# Patient Record
Sex: Male | Born: 1950
Health system: Southern US, Community
[De-identification: ages and names within clinical notes are randomized; demographics above are authoritative.]

## PROBLEM LIST (undated history)

## (undated) DIAGNOSIS — E65 Localized adiposity: Secondary | ICD-10-CM

## (undated) DIAGNOSIS — N529 Male erectile dysfunction, unspecified: Secondary | ICD-10-CM

## (undated) DIAGNOSIS — I495 Sick sinus syndrome: Secondary | ICD-10-CM

## (undated) DIAGNOSIS — I10 Essential (primary) hypertension: Secondary | ICD-10-CM

## (undated) DIAGNOSIS — T8859XA Other complications of anesthesia, initial encounter: Secondary | ICD-10-CM

## (undated) DIAGNOSIS — M25562 Pain in left knee: Secondary | ICD-10-CM

## (undated) DIAGNOSIS — G4733 Obstructive sleep apnea (adult) (pediatric): Secondary | ICD-10-CM

## (undated) DIAGNOSIS — R42 Dizziness and giddiness: Secondary | ICD-10-CM

## (undated) DIAGNOSIS — E6609 Other obesity due to excess calories: Secondary | ICD-10-CM

## (undated) DIAGNOSIS — Z9989 Dependence on other enabling machines and devices: Secondary | ICD-10-CM

## (undated) DIAGNOSIS — M7580 Other shoulder lesions, unspecified shoulder: Secondary | ICD-10-CM

## (undated) DIAGNOSIS — Z950 Presence of cardiac pacemaker: Secondary | ICD-10-CM

## (undated) DIAGNOSIS — M722 Plantar fascial fibromatosis: Secondary | ICD-10-CM

## (undated) DIAGNOSIS — R55 Syncope and collapse: Secondary | ICD-10-CM

## (undated) DIAGNOSIS — T4145XA Adverse effect of unspecified anesthetic, initial encounter: Secondary | ICD-10-CM

## (undated) DIAGNOSIS — I4510 Unspecified right bundle-branch block: Secondary | ICD-10-CM

## (undated) DIAGNOSIS — F419 Anxiety disorder, unspecified: Secondary | ICD-10-CM

## (undated) DIAGNOSIS — K219 Gastro-esophageal reflux disease without esophagitis: Secondary | ICD-10-CM

## (undated) HISTORY — DX: Other shoulder lesions, unspecified shoulder: M75.80

## (undated) HISTORY — DX: Other obesity due to excess calories: E66.09

## (undated) HISTORY — DX: Plantar fascial fibromatosis: M72.2

## (undated) HISTORY — DX: Pain in left knee: M25.562

## (undated) HISTORY — PX: DECOMPRESSIVE LUMBAR LAMINECTOMY LEVEL 4: SHX5794

## (undated) HISTORY — DX: Unspecified right bundle-branch block: I45.10

## (undated) HISTORY — PX: MICRODISCECTOMY LUMBAR: SUR864

## (undated) HISTORY — DX: Syncope and collapse: R55

## (undated) HISTORY — DX: Obstructive sleep apnea (adult) (pediatric): G47.33

## (undated) HISTORY — PX: DECOMPRESSIVE LUMBAR LAMINECTOMY LEVEL 3: SHX5793

## (undated) HISTORY — DX: Sick sinus syndrome: I49.5

## (undated) HISTORY — PX: SEPTOPLASTY: SUR1290

## (undated) HISTORY — DX: Dizziness and giddiness: R42

## (undated) HISTORY — DX: Localized adiposity: E65

## (undated) HISTORY — DX: Obstructive sleep apnea (adult) (pediatric): Z99.89

## (undated) HISTORY — DX: Male erectile dysfunction, unspecified: N52.9

## (undated) HISTORY — PX: BUNIONECTOMY: SHX129

## (undated) HISTORY — DX: Essential (primary) hypertension: I10

---

## 1992-03-17 HISTORY — PX: COLON SURGERY: SHX602

## 1992-03-17 HISTORY — PX: APPENDECTOMY: SHX54

## 1999-01-06 ENCOUNTER — Ambulatory Visit (HOSPITAL_COMMUNITY): Admission: RE | Admit: 1999-01-06 | Discharge: 1999-01-06 | Payer: Self-pay

## 1999-01-06 ENCOUNTER — Encounter: Payer: Self-pay | Admitting: Neurological Surgery

## 2000-08-31 ENCOUNTER — Emergency Department (HOSPITAL_COMMUNITY): Admission: EM | Admit: 2000-08-31 | Discharge: 2000-08-31 | Payer: Self-pay | Admitting: Emergency Medicine

## 2000-09-09 ENCOUNTER — Emergency Department (HOSPITAL_COMMUNITY): Admission: EM | Admit: 2000-09-09 | Discharge: 2000-09-09 | Payer: Self-pay | Admitting: Emergency Medicine

## 2003-04-06 ENCOUNTER — Ambulatory Visit (HOSPITAL_COMMUNITY): Admission: RE | Admit: 2003-04-06 | Discharge: 2003-04-06 | Payer: Self-pay | Admitting: *Deleted

## 2003-04-15 ENCOUNTER — Emergency Department (HOSPITAL_COMMUNITY): Admission: EM | Admit: 2003-04-15 | Discharge: 2003-04-15 | Payer: Self-pay | Admitting: Emergency Medicine

## 2003-04-20 ENCOUNTER — Ambulatory Visit (HOSPITAL_COMMUNITY): Admission: RE | Admit: 2003-04-20 | Discharge: 2003-04-20 | Payer: Self-pay | Admitting: *Deleted

## 2003-07-17 ENCOUNTER — Inpatient Hospital Stay (HOSPITAL_COMMUNITY): Admission: RE | Admit: 2003-07-17 | Discharge: 2003-07-19 | Payer: Self-pay | Admitting: Cardiovascular Disease

## 2003-07-18 HISTORY — PX: PACEMAKER INSERTION: SHX728

## 2006-01-15 ENCOUNTER — Ambulatory Visit (HOSPITAL_COMMUNITY): Admission: RE | Admit: 2006-01-15 | Discharge: 2006-01-15 | Payer: Self-pay | Admitting: Cardiovascular Disease

## 2006-10-07 ENCOUNTER — Encounter: Admission: RE | Admit: 2006-10-07 | Discharge: 2006-10-07 | Payer: Self-pay | Admitting: Orthopedic Surgery

## 2006-10-21 ENCOUNTER — Ambulatory Visit (HOSPITAL_COMMUNITY): Admission: RE | Admit: 2006-10-21 | Discharge: 2006-10-22 | Payer: Self-pay | Admitting: Orthopedic Surgery

## 2006-10-21 HISTORY — PX: BACK SURGERY: SHX140

## 2007-02-19 ENCOUNTER — Encounter: Admission: RE | Admit: 2007-02-19 | Discharge: 2007-02-19 | Payer: Self-pay | Admitting: Internal Medicine

## 2007-02-26 ENCOUNTER — Encounter: Admission: RE | Admit: 2007-02-26 | Discharge: 2007-02-26 | Payer: Self-pay | Admitting: Gastroenterology

## 2010-07-30 NOTE — Op Note (Signed)
NAMESAMARTH, OGLE NO.:  [id number]   MEDICAL RECORD NO.:  [id number]          PATIENT TYPE:  AMB   LOCATION:  DAY                          FACILITY:  [facility]   PHYSICIAN:  Georges Lynch. Gioffre, M.D.DATE OF BIRTH:  1951-03-13   DATE OF PROCEDURE:  10/21/2006  DATE OF DISCHARGE:                               OPERATIVE REPORT   SURGEON:  Georges Lynch. Darrelyn Hillock, M.D.   ASSISTANT:  Marlowe Kays M.D.   PREOPERATIVE DIAGNOSES:  1. Spinal stenosis at L3-4.  2. Herniated lumbar disc at L3-4, with migration of the disc fragments      cephalad.  He had right leg pain only preoperatively, and the pain      went down into his right anterior thigh, down to the right knee and      slightly distal.  3. He had a previous lumbar laminectomy in 1985 at L5-S1 on the right.   POSTOPERATIVE DIAGNOSES:  1. Spinal stenosis at L3-4.  2. Herniated lumbar disc at L3-4, with migration of the disc fragments      cephalad.  He had right leg pain only preoperatively, and the pain      went down into his right anterior thigh, down to the right knee and      slightly distal.  3. He had a previous lumbar laminectomy in 1985 at L5-S1 on the right.   OPERATIONS:  1. Decompressive lumbar laminectomy for spinal stenosis at L3-4.  2. Microdiskectomy at L3-4 on the right.  3. Foraminotomy on the right at L3-4.   PROCEDURE:  The patient first had 2 g of IV Ancef preop.  At this time,  a sterile prep and draping were carried out with the patient on the  spinal frame.  Two needles were placed in the back for localization  purposes, and an x-ray was taken.  Once we localized the L3-4  interspace, an incision was made.  Bleeders were identified and  cauterized.  The muscle then was stripped from the lamina and spinous  processes in the usual fashion.  I then took another x-ray to verify our  position, and finally a third x-ray was taken as well.  We then carried  out the central decompressive lumbar  laminectomy in the usual fashion.  We removed a markedly thickened ligamentum flavum.  We exposed the dura,  brought the microscope in, and went out far laterally in the recess on  the right at L3-4 because of the nature of the disc rupture.  The disc  rupture migrated cephalad and also out into the foramen.  So, once we  cleared the lateral recess, we cauterized the lateral recess veins with  the bipolar.  I then made a cruciate incision into the disc space and a  large amount of disc under pressure came out.  Following that, I went up  with a nerve hook up cephalad over the body of L3 and removed several  other pieces of disc.  Following that, we went down into the disc space  again to complete the diskectomy.  We went across the midline,  subligamentous  as well, and out into the foramen as well.  Once we had  the procedure completed with a diskectomy, the dura now was rather  freely.  The nerve root was very easily mobile. We were able to easily  displace it.  We examined the foramen with a hockey-stick.  It was nice  and free.  We thoroughly irrigated out the area and removed the fluid,  and loosely applied some thrombin-soaked Gelfoam.  The wound was closed  in layers in the usual fashion.  I did leave a small portion of the  proximal and  distal deep portions of the wound open for drainage purposes to avoid  any compression on the sac.  Sterile staples were placed into the skin.  Sterile Neosporin dressing was applied, and the patient left the  operative room in satisfactory condition.  Prior to leaving surgery, he  had Toradol 30 mg IV.           ______________________________  Georges Lynch. Darrelyn Hillock, M.D.     RAG/MEDQ  D:  10/21/2006  T:  10/22/2006  Job:  [id number]

## 2010-08-02 NOTE — Cardiovascular Report (Signed)
NAME:  Brent Stuart, Brent Stuart                       ACCOUNT NO.:  [id number]   MEDICAL RECORD NO.:  [id number]                   PATIENT TYPE:  OIB   LOCATION:  2854                                 FACILITY:  [facility]   PHYSICIAN:  Darlin Priestly, M.D.             DATE OF BIRTH:  03/24/1950   DATE OF PROCEDURE:  04/06/2003  DATE OF DISCHARGE:                              CARDIAC CATHETERIZATION   PROCEDURE:  Heads-up tilt-table testing with Isuprel infusion.   COMPLICATIONS:  None.   INDICATIONS:  [name] is a 60 year old male, patient of Dr. Caprice Kluver,  with a history of recurrent syncope with negative neurologic workup.  He has  had a positive tilt-table test according to his notes.  However, the patient  denies that he did have true loss of consciousness during the test.  He has  had a subsequent tilt-table test at [facility] which he believes was  negative.  He is now referred for repeat tilt-table testing.  His last  episode of syncope was in 2002.   DESCRIPTION OF OPERATION:  After giving informed consent the patient was  brought to the cardiac catheterization lab where his heart rate and blood  pressure were monitored.  He was placed in the supine position with resting  blood pressure of 137/92, resting heart rate of 96.  He was monitored for  approximately 10 minutes and then tilted to a 7-degree heads-up position.  This was maintained for approximately 30 minutes through which he remained  hemodynamically stable and asymptomatic.  He was again returned to a supine  position, and Isuprel infusion was begun.  This was maintained for  approximately 5-10 minutes, then he was again tilted a 7-degree heads-up  position where he was monitored for approximately 10 minutes.  He had no  drop in blood pressure or heart rate and remained asymptomatic.  The patient  was then again returned to a supine position.   CONCLUSION:  Negative heads-up tilt-table testing with Isuprel  infusion.                                               Darlin Priestly, M.D.    RHM/MEDQ  D:  04/06/2003  T:  04/07/2003  Job:  [id number]   cc:   Thereasa Solo. Little, M.D.  [address]  [address]  [place]  [place] [address]  Fax: [phone]

## 2010-08-02 NOTE — H&P (Signed)
NAME:  ISLAM, EICHINGER                       ACCOUNT NO.:  [id number]   MEDICAL RECORD NO.:  [id number]                   PATIENT TYPE:  INP   LOCATION:  2007                                 FACILITY:  [facility]   PHYSICIAN:  Richard A. Alanda Amass, M.D.          DATE OF BIRTH:  [birth year]   DATE OF ADMISSION:  07/17/2003  DATE OF DISCHARGE:                                HISTORY & PHYSICAL   Philemon Riedesel is a 60 year old white grandfather of two.  He is a smoker.  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  The patient was  admitted now for pacemaker implantation  __________  ventricular asystole of  13 seconds on July 15, 2003.   Mr. Desai is a patient of Dr. Mindi Junker Little's.  He had abdominal surgery done  in 1995 which was appendicitis complicated by colon involvement and had  partial colon resection.  Shortly after that, he had his first frank  syncopal episode.  Between 1995 and 2000, he had approximately 8-9 episodes  of frank syncope.  He had a negative tilt table test and during that period  of time, Cardiolite and echocardiogram.  The etiology of his syncope was  unclear, but he was disabled because of this, and he has not driven since  1997.   He also has a remote diagnosis of obstructive sleep apnea and is on CPAP.  He has been followed closely by Dr. Clarene Duke.   He was reevaluated March 22, 2003 by Dr. Clarene Duke and patient had not driven  in 5 years, and there was some question about allowing him to drive again.  For this reason, he underwent repeat tilt table testing which was negative.  He had seen Dr. Graciela Husbands in the past and a loop recorder was not advised. Dr.  Clarene Duke recommended a loop recorder if he had a negative tilt table or  recurrent symptoms.   Several days after seeing Dr. Clarene Duke, he had a recurrent syncopal episode  January 2005, was seen in the emergency room, no significant abnormalities  were noted. He subsequently underwent a loop recorder by  Dr. Jenne Campus which  was a Reveal Plus,  Model [id number], Serial [id number] implanted on April 20, 2003 in the left  parasternal region. This had been evaluated by Dr. Jenne Campus on 2 occasions,  February 11 and May 19, 2003 and there were no arrhythmias noted.  The  pervious episode prompting loop recorder was April 11, 2003 at a bar.   The patient walks about 2 miles three to four times a week, is a nonsmoker,  has exogenous obesity.   On Saturday, July 15, 2003, he had a syncopal episode while sitting on the  couch at home at approximately 11:52 a.m.  The patient had a faint aura of  presyncope but no palpitations or chest pain, had a frank syncopal episode  prior to his frank syncope.  He called for his daughter because  he thought  he might pass out. Over the last five years, he has had episodic presyncope  but these have been fairly transient up until this episode.  His eyes rolled  to his side but there was no seizure activity, incontinence of bowel or  urine.   He comes in today for clinical evaluation and interrogation of his Reveal.   On exam, his Reveal shows sinus arrest with episodes of six and three  seconds asystole, and then a long episode of 14 seconds of ventricular  asystole corresponding to his syncopal episode.  He has had no other  arrhythmias.   PHYSICAL EXAMINATION:  VITAL SIGNS:  He is 239, 6 feet 2, appears healthy.  HEENT:  No xanthelasmas, PERRLA, EOM intact.  NECK:  No carotid bruits.  There is no JVD, or HJR.  Thyroid not enlarged.  LUNGS:  Clear to P&A with no wheezes, rubs or rhonchi.  CARDIOVASCULAR:  Regular rate and rhythm.  He has a localized LV heave in  the left 6th ICS and the MCL. There is an S4, no S3.  No significant murmur,  no rub.  ABDOMEN:  Liver, spleen and kidneys not palpable.  Not palpable.  EXTREMITIES:  Femorals intact, DP and PT intact. No pathologic reflexes.   LABORATORY DATA:  A 12-lead electrocardiogram shows sinus rhythm with  minor  nonspecific ST-T changes, QRS duration of 0.86, unchanged from prior EKG's,  resting rate of 97.  No orthostatic blood pressure drop.   Cardiolite July 2003 showed an EF of 65% with no ischemia.   On April 27, 2003, laboratories showed normal CBC and differential,  BUN/creatinine 14/1.2.  H&H normal.  Chest x-ray shows loop recorder in the  left parasternal area, small cardiac silhouette, no infiltrates or effusions  and no masses.   FAMILY HISTORY:  Not significant for this problem, but his father is living  and had bypass surgery, is now age 62. He had bypass surgery at age 4.  Mother is living at 75.  He has 2 brothers, 12 and 27, alive and well, a  sister 59 alive and well and his 2 children are 44 and 61.   MEDICATIONS:  1. Toprol  XL 50.  2. Flexeril 10.  3. One baby aspirin a day.   ADMISSION DIAGNOSES:  1. Recurrent syncope with documented sinus arrest and ventricular asystole,     up to 13 seconds, corresponding to episode.  2. Recurrent syncope since 1995.  3. Exogenous obesity.  4. History of sleep apnea on CPAP.  5. Multiple prior syncopal episodes since 1995 with negative tilt table     test, negative Cardiolite for ischemia July 2003.  6. Remote abdominal surgery with partial colon resection and appendectomy     1995.   The procedure and risks of Reveal ex-plant and dual-chamber permanent  pacemaker implantation fully explained to the patient. He is agreeable to  proceed with this, and we plan to admit him to the hospital for this. He is  quite anxious about his recent episode, especially when the findings on loop  recorder were revealed to him. He prefers to go ahead with implantation as  soon as possible.                                                Richard A. Alanda Amass, M.D.  RAW/MEDQ  D:  07/17/2003  T:  07/17/2003  Job:  [id number]   cc:   Thora Lance, M.D.  [address]  [place] [address]  Fax: [phone]   Thereasa Solo. Little, M.D.  [address]  [address]  [place]  [place] [address]  Fax: [phone]

## 2010-08-02 NOTE — Discharge Summary (Signed)
NAME:  Brent Stuart, Brent Stuart                       ACCOUNT NO.:  [id number]   MEDICAL RECORD NO.:  [id number]                   PATIENT TYPE:  INP   LOCATION:  2007                                 FACILITY:  [facility]   PHYSICIAN:  Richard A. Alanda Amass, M.D.          DATE OF BIRTH:  03-03-1951   DATE OF ADMISSION:  07/17/2003  DATE OF DISCHARGE:  07/19/2003                                 DISCHARGE SUMMARY   DISCHARGE DIAGNOSIS:  Syncope with sinus pause noted on Loop recorder,  status post Medtronic pacemaker implantation this admission.   HOSPITAL COURSE:  Brent Stuart is a 60 year old male followed by Dr. Clarene Duke.  He had abdominal surgery in 1995 for appendicitis.  Shortly after that, he  had a syncopal spell.  Between 1995 and 2000 he had eight to nine episodes  of syncope.  During that time he has had a negative cardiac evaluation  including a Cardiolite study, echocardiogram and tilt table.  He has been  diagnosed with sleep apnea and is on CPAP.  In January 2005, he had a  recurrent episode of syncope and a Loop recorder was implanted by  Dr. Jenne Campus on February 3.  The patient has done well.  He walks two miles  three to four times a week.  Saturday, April 30, he had a syncopal spell  while watching TV at 11 a.m.  Loop recorder showed sinus arrest with one  episode of 14 seconds of ventricular asystole.  He was seen in the office by  Dr. Alanda Amass on Jul 17, 2003 and set up for pacemaker implantation which was  done Jul 18, 2003.  He had a Medtronic dual chamber pacemaker implanted  without complications.  We feel he can be discharged Jul 19, 2003.  Chest x-  ray prior to discharge showed no pneumothorax and pacer wires in good  position.   DISCHARGE MEDICATIONS:  None.   LABORATORIES:  White count 6.5, hemoglobin 15.4, hematocrit 43.1, platelets  225, INR 0.9.  Sodium 137, potassium 3.5, BUN 21, creatinine 1.2.  LFTs are  normal.   DISPOSITION:  The patient is discharged in stable  condition after pacemaker  implantation for syncope.  He will follow up with Corine Shelter PA-C on May 11  at 10:30 A.M. for a wound check and then follow up with  Dr. Clarene Duke and Dr. Alanda Amass.      Abelino Derrick, P.A.                      Richard A. Alanda Amass, M.D.    Lenard Lance  D:  07/19/2003  T:  07/20/2003  Job:  [id number]   cc:   Thereasa Solo. Little, M.D.  [address]  [address]  [place]  [place] [address]  Fax: [phone]

## 2010-08-02 NOTE — Cardiovascular Report (Signed)
NAME:  ADARIUS, TIGGES                       ACCOUNT NO.:  [id number]   MEDICAL RECORD NO.:  [id number]                   PATIENT TYPE:  OIB   LOCATION:  2853                                 FACILITY:  [facility]   PHYSICIAN:  Darlin Priestly, M.D.             DATE OF BIRTH:  10-Oct-1950   DATE OF PROCEDURE:  04/20/2003  DATE OF DISCHARGE:  04/20/2003                              CARDIAC CATHETERIZATION   PROCEDURE:  Loop recorder implant.   CARDIOLOGIST:  Darlin Priestly, M.D.   COMPLICATIONS:  None.   INDICATIONS:  Mr. Chain is a 60 year old male patient of Dr. Caprice Kluver  and Dr. Kirby Funk with a history of hypertension, dyslipidemia,  obstructive sleep apnea and recurrent syncope dating back approximately 5  years.  The patient has had a negative workup including ultrasounds,  Cardiolites and tilt table testing.  He is now referred for a loop recorder  implant to rule out significant arrhythmias and possible etiology.   DESCRIPTION OF PROCEDURE:  After informed consent the patient was brought to  the cardiac catheterization lab in a fasting state.  The left chest wall was  shaved, prepped and draped in a sterile fashion.  Anesthesia monitoring was  established.  Lidocaine 1% was then used to anesthetize approximately 2 cm  beneath the left clavicle and 2 cm left of the sternum.  Approximately 1.5-  to-2-cm, horizontal incision was then performed and blunt dissection was  used to carry this down to the left pectoral fascia.  Hemostasis was  obtained with electrocautery.   Next, an approximately 1 x 4 cm pocket was then created over the left  pectoral fascia and hemostasis was obtained.  Two silk sutures were then  placed at the apex of the pocket and the pocket was then copiously irrigated  with 1% Garamycin solution.   Next a Reveal Plus Model [id number], serial [id number] was then inserted into  the pocket and the 2 silk sutures then anchored to the loop header.  The  pocket subcutaneous layer was then closed using a running 2-0 Dexon with  closure of the skin layer using running 5-0 Dexon.  Steri-Strips were  applied.  The patient was then transferred to the recovery room in stable  condition.   CONCLUSION:  Successful implant of a Reveal Plus Model [id number], serial  [id number].                                               Darlin Priestly, M.D.    RHM/MEDQ  D:  04/20/2003  T:  04/21/2003  Job:  [id number]   cc:   Thereasa Solo. Little, M.D.  [address]  [address]  [place]  [place] [address]  Fax: [phone]   Thora Lance, M.D.  [address] [address]  [address]  [place]  [place] [address]  Fax: [phone]

## 2010-08-02 NOTE — Cardiovascular Report (Signed)
NAME:  Brent Stuart, Brent Stuart                       ACCOUNT NO.:  [id number]   MEDICAL RECORD NO.:  [id number]                   PATIENT TYPE:  INP   LOCATION:  2007                                 FACILITY:  [facility]   PHYSICIAN:  Richard A. Alanda Amass, M.D.          DATE OF BIRTH:  [birth year]   DATE OF PROCEDURE:  07/18/2003  DATE OF DISCHARGE:                              CARDIAC CATHETERIZATION   PROCEDURE:  Explantation of Reveal loop recorder-Medtronic Reveal Plus  #9526, SN:  [id number] (DOI April 20, 2003), implantation of new Medtronic  DDDR Enpulse model number [id number]; SN:  [id number] H pulse generator with  bipolar silicone steroid-eluting passive fixation tined Medtronic 53CM  atrial and 58CM ventricular electrodes #5594 and #5092 respectively.   IMPLANTING PHYSICIAN:  Richard A. Alanda Amass, M.D.   COMPLICATIONS:  None.   ESTIMATED BLOOD LOSS:  Approximately 60 mL.   ANESTHESIA:  5 mg of Valium p.o. premedication, 1 g of Ancef IV antibiotic  prophylaxis, intermittent Nubain 4 mg IV, intermittent Versed 6 mg IV during  procedure for sedation.   PREOPERATIVE DIAGNOSES:  1. Recurrent syncope since 1995.  2. Syncope with documented sinus arrest and ventricular asystole 13 seconds     on Reveal loop recorder Jul 17, 2003.  3. Prior negative tilt table test.  4. Abdominal surgery with appendectomy and partial colon removed in 1995.  5. Obstructive sleep apnea on CPAP.  6. Exogenous obesity.   POSTOPERATIVE DIAGNOSES:  1. Recurrent syncope since 1995.  2. Syncope with documented sinus arrest and ventricular asystole 13 seconds     on Reveal loop recorder Jul 17, 2003.  3. Prior negative tilt table test.  4. Abdominal surgery with appendectomy and partial colon removed in 1995.  5. Obstructive sleep apnea on CPAP.  6. Exogenous obesity.   Please refer to H&P for details.   ATRIAL ELECTRODE:  Medtronic [id number], SN:  [id number].   VENTRICULAR ELECTRODE:  Medtronic [id number], SN:   [id number].   There was no retrograde V-A conduction at a ventricular pacing rate of 120  with atrial recording through the implanted electrodes.   Antegrade Wenckebach  AV node was present at an atrial pacing rate of 170  per minute in the temporary AAI mode through the PSA.   Magnet rate at BOL equals 85, at [facility] magnet rate drops to 65 per minute.   THRESHOLDS:  Bipolar atrial:  Capture threshold 0.6 V at 0.5 msec; impedance  573 ohms; P equal 8.0 mV.   Bipolar ventricular:  Capture 0.3 V at 0.5 msec, resistance 733 ohms, R  waves 12.8 mV.   Unipolar atrial:  Capture threshold 0.3 V at 0.5 msec, impedance 438 ohms, P  wave 6.3 mV.   Unipolar ventricular:  Capture 0.2 V at 0.5 msec, resistance 629 ohms, R  equal 10.0 mV.   PROCEDURE:  The patient was brought from the telemetry unit to the second CP  lab in a postabsorptive state  after 5 mg of Valium p.o.  He was given  intermittent Nubain and Versed for sedation in the laboratory.  1% Xylocaine  was used for local anesthesia.  He was given 1 g of Ancef for IV prophylaxis  (remote history of penicillin allergy).   The left anterior chest was prepped, draped in the usual manner as well as  the implant left chest pacer site and the patient's previously implanted  Reveal loop recorder.  1% local Xylocaine was used to anesthetize the Reveal  loop recorder site.  A transverse incision was performed just below the  previously placed incision.  Blunt dissection was used to free up the loop  recorder. The two anchoring silk sutures were removed in total. The loop  recorder and its subcutaneous pocket was delivered.  Electrocautery was used  to control hemostasis.  The pocket was irrigated with kanamycin solution and  closed with two running layers of 2-0 subcutaneous Dexon suture and the skin  was closed with 5-0 subcuticular Dexon suture with Steri-Strips applied at  the end of the procedure.   A left infraclavicular curvilinear  transverse incision was performed and  brought down to the prepectoral fascia using blunt dissection and  electrocautery to control hemostasis.  The patient had been given  preoperative aspirin.  There was considerable oozing from the pocket which  was controlled with hemostasis and electrocautery.  The extrathoracic left  subclavian vein was entered under fluoroscopic control with a single  anterior puncture using an 18-thin wall needle and using two #9 Peel-Away  Cook introducers.  The atrial and ventricular electrodes were introduced in  tandem with a retaining J-tip stainless steel guide wire that was removed  after positioning of the electrodes.  The ventricular electrode was  positioned near the RV apex and was stable.  The atrial electrode was  positioned in the right atrial appendage confirmed by fluoroscopy and  rotational maneuvers and was stable.  The threshold testing was performed  unipolar and bipolar along with retrograde conduction and atrial pacing for  antegrade conduction as outlined above. Excellent unipolar and bipolar  thresholds and sensing parameters were obtained.  The electrodes were  secured at the insertion site with a #1 previously placed figure-of-eight  silk suture around a tissue sewing collar to prevent migration and control  hemostasis.  It was further secured with two #1 interrupted silk sutures  around a silicone sewing collar for each electrode.  The pocket was  irrigated with 500 mg of kanamycin solution.  The generator was hooked to  the electrodes in the proper atrial and ventricular sequence with single hex  nut tightened for each electrode.  It was delivered into the pocket with the  electrodes looped behind.  The generator was loosely secured to the  underlying muscle fascia with #1 silk suture to prevent migration.  The  subcutaneous tissue was closed with 2-0 Vicryl subcutaneous running layer of suture and a second 3-0 running layer of Dexon.   The skin was closed with 5-  0 subcuticular Dexon suture.  Steri-Strips were applied.  Fluoroscopy showed  good position of the atrial and ventricular electrodes.  The patient was  overriding his pacer at the time of post implant and he was transferred to  the holding area for postoperative care and programming in stable condition.  Richard A. Alanda Amass, M.D.    RAW/MEDQ  D:  07/18/2003  T:  07/18/2003  Job:  [id number]   cc:   Thereasa Solo. Little, M.D.  [address]  [address]  [place]  [place] [address]  Fax: [phone]   Thora Lance, M.D.  [address]  [place]  [place] [address]  Fax: [phone]   Darlin Priestly, M.D.  [phone]  [place]  [place] [address]  Fax: [phone]

## 2010-12-30 LAB — BASIC METABOLIC PANEL
BUN: 16
CO2: 28
Creatinine, Ser: 1.04
GFR calc Af Amer: >60
Glucose, Bld: 100 — ABNORMAL HIGH
Potassium: 4.1
Sodium: 142

## 2010-12-30 LAB — APTT: aPTT: 30

## 2010-12-30 LAB — HEMOGLOBIN AND HEMATOCRIT, BLOOD: Hemoglobin: 16.9

## 2012-09-16 ENCOUNTER — Other Ambulatory Visit: Payer: Self-pay | Admitting: *Deleted

## 2012-09-16 DIAGNOSIS — Z950 Presence of cardiac pacemaker: Secondary | ICD-10-CM

## 2012-09-16 DIAGNOSIS — R011 Cardiac murmur, unspecified: Secondary | ICD-10-CM

## 2012-09-21 ENCOUNTER — Encounter: Payer: Self-pay | Admitting: Cardiovascular Disease

## 2012-09-28 ENCOUNTER — Ambulatory Visit (HOSPITAL_COMMUNITY)
Admission: RE | Admit: 2012-09-28 | Discharge: 2012-09-28 | Disposition: A | Discharge status: Home / Self Care | Payer: 59 | Source: Ambulatory Visit | Attending: Cardiovascular Disease | Admitting: Cardiovascular Disease

## 2012-09-28 DIAGNOSIS — R011 Cardiac murmur, unspecified: Secondary | ICD-10-CM

## 2012-09-28 DIAGNOSIS — Z950 Presence of cardiac pacemaker: Secondary | ICD-10-CM

## 2012-09-28 DIAGNOSIS — I079 Rheumatic tricuspid valve disease, unspecified: Secondary | ICD-10-CM | POA: Insufficient documentation

## 2012-09-28 DIAGNOSIS — I10 Essential (primary) hypertension: Secondary | ICD-10-CM | POA: Insufficient documentation

## 2012-09-28 DIAGNOSIS — I059 Rheumatic mitral valve disease, unspecified: Secondary | ICD-10-CM | POA: Insufficient documentation

## 2012-09-28 DIAGNOSIS — I379 Nonrheumatic pulmonary valve disorder, unspecified: Secondary | ICD-10-CM | POA: Insufficient documentation

## 2012-09-28 NOTE — Progress Notes (Signed)
Brent Stuart   2D echo completed 09/28/2012.   Cindy Elvie Maines, RDCS  

## 2012-10-08 ENCOUNTER — Telehealth: Payer: Self-pay | Admitting: Cardiovascular Disease

## 2012-10-08 NOTE — Telephone Encounter (Signed)
Calling about the results of his echo cardiogram..

## 2012-10-15 NOTE — Telephone Encounter (Signed)
Talked to patient about his echo and gave him a copy

## 2013-01-20 ENCOUNTER — Telehealth: Payer: Self-pay | Admitting: Internal Medicine

## 2013-01-20 NOTE — Telephone Encounter (Signed)
lmm @ 432pm for pt to make device fu appt with allred , former [facility]ra[facility] pt/mt

## 2013-03-02 ENCOUNTER — Encounter: Payer: Self-pay | Admitting: Internal Medicine

## 2013-04-13 DIAGNOSIS — B351 Tinea unguium: Secondary | ICD-10-CM

## 2013-05-09 ENCOUNTER — Ambulatory Visit: Payer: 59 | Admitting: Internal Medicine

## 2013-05-09 ENCOUNTER — Ambulatory Visit (INDEPENDENT_AMBULATORY_CARE_PROVIDER_SITE_OTHER): Payer: 59 | Admitting: Internal Medicine

## 2013-05-09 ENCOUNTER — Encounter: Payer: Self-pay | Admitting: Internal Medicine

## 2013-05-09 VITALS — BP 150/100 | HR 104 | Ht 73.5 in | Wt 244.0 lb

## 2013-05-09 DIAGNOSIS — I10 Essential (primary) hypertension: Secondary | ICD-10-CM | POA: Insufficient documentation

## 2013-05-09 DIAGNOSIS — E663 Overweight: Secondary | ICD-10-CM | POA: Insufficient documentation

## 2013-05-09 DIAGNOSIS — Z950 Presence of cardiac pacemaker: Secondary | ICD-10-CM

## 2013-05-09 DIAGNOSIS — G4730 Sleep apnea, unspecified: Secondary | ICD-10-CM | POA: Insufficient documentation

## 2013-05-09 DIAGNOSIS — R55 Syncope and collapse: Secondary | ICD-10-CM

## 2013-05-09 DIAGNOSIS — I495 Sick sinus syndrome: Secondary | ICD-10-CM

## 2013-05-09 NOTE — Patient Instructions (Signed)
Your physician wants you to follow-up in: 12 months with Dr Vallery Ridge will receive a reminder letter in the mail two months in advance. If you don't receive a letter, please call our office to schedule the follow-up appointment.  Remote monitoring is used to monitor your Pacemaker of ICD from home. This monitoring reduces the number of office visits required to check your device to one time per year. It allows [place] to keep an eye on the functioning of your device to ensure it is working properly. You are scheduled for a device check from home on 08/10/13. You may send your transmission at any time that day. If you have a wireless device, the transmission will be sent automatically. After your physician reviews your transmission, you will receive a postcard with your next transmission date.

## 2013-05-09 NOTE — Progress Notes (Signed)
Brent Shelling, MD: Primary Cardiologist: formerly Dr Jamelle Rushing is a 63 y.o. male with a h/o sick sinus syndrome and neurocardiogenic syncope sp PPM (MDT) by Dr Rollene Fare who presents today to establish care in the Electrophysiology device clinic.  The patient reports doing very well since having a pacemaker implanted and remains very active despite his age.  He previously had recurrent syncope.  He underwent ILR implant which documented prolonged sinus pauses.  He underwent PPM implant and has no further syncope.  Today, he  denies symptoms of palpitations, chest pain, shortness of breath, orthopnea, PND, lower extremity edema, dizziness,  or neurologic sequela.  The patientis tolerating medications without difficulties and is otherwise without complaint today.   Past Medical History  Diagnosis Date  . Orthostatic lightheadedness     Occasional. Only one episode in 08/2012 when he was not in [employer] of presyncope that was transient.  . Hypertension     mild  . Impotence     mild  . Sick sinus syndrome     Significant bradycardia with prolonged asystole ans syncope, prompting his pacemaker implant in 2005.  Marland Kitchen Neurocardiogenic syncope     Hx of chronic neurocardiogenic syncope and he has had a negative tilt-table in the past, but significant bradycardia with prolonged asystole ans syncope, prompting his pacemaker implant in 2005.  Marland Kitchen RBBB   . AC (acromioclavicular) joint bone spurs     On feet  . Left knee pain   . OSA on CPAP   . Truncal obesity   . Exogenous obesity     mild  . Plantar fasciitis    Past Surgical History  Procedure Laterality Date  . Pacemaker insertion  07/18/03    Medtronic Enpulse [id number] implanted by Dr Rollene Fare  . Colon surgery  1994    By Dr. Leafy Kindle for nonmalignant disease with associated appendectomy  . Appendectomy  1994  . Decompressive lumbar laminectomy level 3    . Decompressive lumbar laminectomy level 4    .  Microdiscectomy lumbar    . Back surgery  10/21/2006    Foraminotomy  . Bunionectomy Right     First MTP area    History   Social History  . Marital Status: Married    Spouse Name: N/A    Number of Children: N/A  . Years of Education: N/A   Occupational History  . Not on file.   Social History Main Topics  . Smoking status: Never Smoker   . Smokeless tobacco: Not on file  . Alcohol Use: No  . Drug Use: No  . Sexual Activity: Not on file   Other Topics Concern  . Not on file   Social History Narrative   Previously a IT trainer    Family History  Problem Relation Age of Onset  . Hypertension      Allergies  Allergen Reactions  . Penicillins     Current Outpatient Prescriptions  Medication Sig Dispense Refill  . ALPRAZolam (XANAX) 0.25 MG tablet Pt is taking 1/2 tablet maybe twice a month      . Aspirin-Acetaminophen-Caffeine (EXCEDRIN MIGRAINE PO) Take 1 tablet by mouth as needed.      . cyclobenzaprine (FLEXERIL) 10 MG tablet Pt is taking 1/2 tablet maybe twice a month      . lansoprazole (PREVACID) 15 MG capsule Take 15 mg by mouth daily at 12 noon.      . metoprolol tartrate (LOPRESSOR) 25 MG tablet Take 25  mg by mouth 2 (two) times daily.      . tadalafil (CIALIS) 10 MG tablet Take 10 mg by mouth as needed for erectile dysfunction.      . valsartan (DIOVAN) 80 MG tablet Take 80 mg by mouth daily.       No current facility-administered medications for this visit.    ROS- all systems are reviewed and negative except as per HPI  Physical Exam: Filed Vitals:   05/09/13 1640  BP: 150/100  Pulse: 104  Height: 6' 1.5" (1.867 m)  Weight: 244 lb (110.678 kg)    GEN- The patient is well appearing, alert and oriented x 3 today.   Head- normocephalic, atraumatic Eyes-  Sclera clear, conjunctiva pink Ears- hearing intact Oropharynx- clear Neck- supple  Lungs- Clear to ausculation bilaterally, normal work of breathing Chest- pacemaker pocket is well  healed Heart- Regular rate and rhythm, no murmurs, rubs or gallops, PMI not laterally displaced GI- soft, NT, ND, + BS Extremities- no clubbing, cyanosis, or edema MS- no significant deformity or atrophy Skin- no rash or lesion Psych- euthymic mood, full affect Neuro- strength and sensation are intact  Pacemaker interrogation- reviewed in detail today,  See PACEART report Echo 7/14 is reviewed More than 20 pages of records from Dr Rollene Fare are reviewed  Assessment and Plan:  1. Neurocardiogenic syncope with sinus pauses Normal pacemaker function See Pace Art report No changes today  2. HTN Elevated today though he brings his home BP readings which are actually well controlled No changes today Weight loss is advised  3. OSA Compliance with CPAP is encouraged  carelink every 3 months,  Will need closer follow-up as battery nears ERI Return to see me in 1 year

## 2013-05-10 LAB — MDC_IDC_ENUM_SESS_TYPE_INCLINIC
Brady Statistic AP VS Percent: 0 %
Brady Statistic AS VP Percent: 0 %
Lead Channel Impedance Value: 561 Ohm
Lead Channel Pacing Threshold Amplitude: 0.5 V
Lead Channel Pacing Threshold Amplitude: 1 V
Lead Channel Sensing Intrinsic Amplitude: 4 mV
Lead Channel Setting Sensing Sensitivity: 5.6 mV
MDC IDC MSMT BATTERY IMPEDANCE: 3206 Ohm
MDC IDC MSMT BATTERY REMAINING LONGEVITY: 16 mo
MDC IDC MSMT BATTERY VOLTAGE: 2.69 V
MDC IDC MSMT LEADCHNL RA PACING THRESHOLD PULSEWIDTH: 0.4 ms
MDC IDC MSMT LEADCHNL RV IMPEDANCE VALUE: 784 Ohm
MDC IDC MSMT LEADCHNL RV PACING THRESHOLD PULSEWIDTH: 0.4 ms
MDC IDC MSMT LEADCHNL RV SENSING INTR AMPL: 11.2 mV
MDC IDC SESS DTM: 20150223225949
MDC IDC SET LEADCHNL RA PACING AMPLITUDE: 1.75 V
MDC IDC SET LEADCHNL RV PACING AMPLITUDE: 2.5 V
MDC IDC SET LEADCHNL RV PACING PULSEWIDTH: 0.4 ms
MDC IDC STAT BRADY AP VP PERCENT: 0 %
MDC IDC STAT BRADY AS VS PERCENT: 99 %

## 2013-06-06 ENCOUNTER — Other Ambulatory Visit: Payer: Self-pay | Admitting: *Deleted

## 2013-06-06 MED ORDER — METOPROLOL TARTRATE 25 MG PO TABS: 25.0000 mg | ORAL_TABLET | Freq: Two times a day (BID) | ORAL | Status: DC

## 2013-06-06 MED ORDER — VALSARTAN 80 MG PO TABS: 80.0000 mg | ORAL_TABLET | Freq: Every day | ORAL | Status: DC

## 2013-07-01 ENCOUNTER — Encounter: Payer: Self-pay | Admitting: Internal Medicine

## 2013-07-04 ENCOUNTER — Telehealth: Payer: Self-pay | Admitting: Internal Medicine

## 2013-07-04 NOTE — Telephone Encounter (Signed)
Walk in Pt Form " BP Log" Dropped off Kelly back Tuesday ( 4.21) Will hold Till she returns  4.20.15/kdm

## 2013-07-06 ENCOUNTER — Telehealth: Payer: Self-pay | Admitting: Internal Medicine

## 2013-07-06 NOTE — Telephone Encounter (Addendum)
Spoke with patient and let him know his BP's looked okay and will get Dr Jackalyn Lombard opinion

## 2013-07-06 NOTE — Telephone Encounter (Signed)
New Message:  Pt states he is calling regarding his BP.Brent Stuart States he dropped off a letter on Monday and wants to know the status of that.Brent KitchenMarland Stuart

## 2013-07-07 NOTE — Telephone Encounter (Signed)
Dr Rayann Heman reviewed and is not going to make any changes   I have left a message for the patient in regards to Dr Jackalyn Lombard recommendations

## 2013-08-10 ENCOUNTER — Encounter: Payer: Self-pay | Admitting: Internal Medicine

## 2013-08-10 ENCOUNTER — Ambulatory Visit (INDEPENDENT_AMBULATORY_CARE_PROVIDER_SITE_OTHER): Payer: 59 | Admitting: *Deleted

## 2013-08-10 DIAGNOSIS — I495 Sick sinus syndrome: Secondary | ICD-10-CM

## 2013-08-10 DIAGNOSIS — R55 Syncope and collapse: Secondary | ICD-10-CM

## 2013-08-10 NOTE — Progress Notes (Signed)
Remote pacemaker transmission.   

## 2013-08-15 ENCOUNTER — Other Ambulatory Visit: Payer: Self-pay | Admitting: Internal Medicine

## 2013-08-15 DIAGNOSIS — R1032 Left lower quadrant pain: Secondary | ICD-10-CM

## 2013-08-16 ENCOUNTER — Ambulatory Visit
Admission: RE | Admit: 2013-08-16 | Discharge: 2013-08-16 | Disposition: A | Discharge status: Home / Self Care | Payer: 59 | Source: Ambulatory Visit | Attending: Internal Medicine | Admitting: Internal Medicine

## 2013-08-16 DIAGNOSIS — R1032 Left lower quadrant pain: Secondary | ICD-10-CM

## 2013-08-16 MED ORDER — IOHEXOL 300 MG/ML  SOLN
125.0000 mL | Freq: Once | INTRAMUSCULAR | Status: AC | PRN
  Administered 2013-08-16: 125 mL via INTRAVENOUS

## 2013-08-22 LAB — MDC_IDC_ENUM_SESS_TYPE_REMOTE
Battery Remaining Longevity: 12 mo
Battery Voltage: 2.68 V
Brady Statistic AP VP Percent: 0 %
Brady Statistic AS VP Percent: 0 %
Lead Channel Pacing Threshold Amplitude: 0.375 V
Lead Channel Sensing Intrinsic Amplitude: 2.8 mV
Lead Channel Sensing Intrinsic Amplitude: 22.4 mV
Lead Channel Setting Pacing Pulse Width: 0.4 ms
Lead Channel Setting Sensing Sensitivity: 4 mV
MDC IDC MSMT BATTERY IMPEDANCE: 3909 Ohm
MDC IDC MSMT LEADCHNL RA IMPEDANCE VALUE: 518 Ohm
MDC IDC MSMT LEADCHNL RA PACING THRESHOLD PULSEWIDTH: 0.4 ms
MDC IDC MSMT LEADCHNL RV IMPEDANCE VALUE: 800 Ohm
MDC IDC MSMT LEADCHNL RV PACING THRESHOLD AMPLITUDE: 1.125 V
MDC IDC MSMT LEADCHNL RV PACING THRESHOLD PULSEWIDTH: 0.4 ms
MDC IDC SESS DTM: 20150527120625
MDC IDC SET LEADCHNL RA PACING AMPLITUDE: 1.75 V
MDC IDC SET LEADCHNL RV PACING AMPLITUDE: 2.5 V
MDC IDC STAT BRADY AP VS PERCENT: 2 %
MDC IDC STAT BRADY AS VS PERCENT: 97 %

## 2013-08-26 ENCOUNTER — Encounter: Payer: Self-pay | Admitting: Cardiology

## 2013-08-29 ENCOUNTER — Telehealth: Payer: Self-pay | Admitting: Internal Medicine

## 2013-08-29 ENCOUNTER — Encounter: Payer: Self-pay | Admitting: Internal Medicine

## 2013-08-29 NOTE — Telephone Encounter (Signed)
Transmission received, patient aware. 

## 2013-08-29 NOTE — Telephone Encounter (Signed)
Pt calling re transmission he did 08-10-12, said he has not received results by phone or letter and has surgery coming up and needs to know results, (looks like letter was not done until Friday June 12th, and then it would have gone to our [place] office to be mailed out then sent on to pt, he may receive it tomorrow) pls call

## 2013-08-30 ENCOUNTER — Other Ambulatory Visit: Payer: Self-pay | Admitting: Urology

## 2013-09-01 ENCOUNTER — Other Ambulatory Visit: Payer: Self-pay | Admitting: Urology

## 2013-09-08 ENCOUNTER — Telehealth: Payer: Self-pay | Admitting: Internal Medicine

## 2013-09-08 NOTE — Telephone Encounter (Signed)
Received request from Nurse fax box, documents faxed for surgical clearance. To: [facility] Fax number: [phone] Attention: [phone]/km

## 2013-09-08 NOTE — Telephone Encounter (Signed)
New problem   Want to know status of clearance form that was fax on 08/30/13,. Please advise.

## 2013-09-08 NOTE — Telephone Encounter (Signed)
Form was faxed 

## 2013-09-21 ENCOUNTER — Ambulatory Visit
Admission: RE | Admit: 2013-09-21 | Discharge: 2013-09-21 | Disposition: A | Discharge status: Home / Self Care | Payer: 59 | Source: Ambulatory Visit | Attending: Gastroenterology | Admitting: Gastroenterology

## 2013-09-21 ENCOUNTER — Other Ambulatory Visit: Payer: Self-pay | Admitting: Gastroenterology

## 2013-09-21 DIAGNOSIS — K922 Gastrointestinal hemorrhage, unspecified: Secondary | ICD-10-CM

## 2013-09-30 ENCOUNTER — Other Ambulatory Visit: Payer: Self-pay | Admitting: Urology

## 2013-10-04 ENCOUNTER — Telehealth: Payer: Self-pay | Admitting: Internal Medicine

## 2013-10-04 NOTE — Telephone Encounter (Signed)
Patient calling with concerns of hypotension that is occasionally symptomatic (dizziness). BP's are typically running under 771 systolical. Examples from this week: 80/54, 75/57, 98/57, 96/60, 101/61. Patient states that some days he only is taking the Diovan 80 mg and one dose of the metoprolol 25 mg. He is scheduled for surgery on 8/5 and would like this addressed prior to then, as he is concerned over the dizziness and just feeling his BP is too low. Patient's phone numbers: [phone] (cell) and [phone] (home).

## 2013-10-04 NOTE — Telephone Encounter (Signed)
Encourage oral hydration Stop diovan Follow-up with primary care

## 2013-10-04 NOTE — Telephone Encounter (Signed)
New message    Patient calling C/O blood pressure today  114/57, has been low as 75/49 . Upcoming surgery on  8/5 .

## 2013-10-05 ENCOUNTER — Encounter (HOSPITAL_COMMUNITY): Payer: Self-pay | Admitting: Pharmacy Technician

## 2013-10-06 NOTE — Telephone Encounter (Signed)
Spoke with patient(extremly anxious), he is having surgery on 8/5.  I let him know Dr Jackalyn Lombard recommendations and he now is telling me his BP are different in L/R arms  He is going to stop the Diovan, stay hydrated and keep a log of his BP's in both arms and call me on Wed with readings

## 2013-10-11 ENCOUNTER — Other Ambulatory Visit: Payer: Self-pay | Admitting: Urology

## 2013-10-11 NOTE — Patient Instructions (Signed)
Brent Stuart  10/11/2013   Your procedure is scheduled on:  10/19/2013  1230pm-430pm  Report to [facility].  Follow the Signs to [place] at  1030      am  Call this number if you have problems the morning of surgery: [phone]   Remember:   Do not eat food or drink liquids after midnight.   Take these medicines the morning of surgery with A SIP OF WATER:    Do not wear jewelry,   Do not wear lotions, powders, or perfumes., deodorant.     . Men may shave face and neck.  Do not bring valuables to the hospital.  Contacts, dentures or bridgework may not be worn into surgery.  Leave suitcase in the car. After surgery it may be brought to your room.  For patients admitted to the hospital, checkout time is 11:00 AM the day of  discharge.           Please read over the following fact sheets that you were given: [facility] - Preparing for Surgery Before surgery, you can play an important role.  Because skin is not sterile, your skin needs to be as free of germs as possible.  You can reduce the number of germs on your skin by washing with CHG (chlorahexidine gluconate) soap before surgery.  CHG is an antiseptic cleaner which kills germs and bonds with the skin to continue killing germs even after washing. Please DO NOT use if you have an allergy to CHG or antibacterial soaps.  If your skin becomes reddened/irritated stop using the CHG and inform your nurse when you arrive at Short Stay. Do not shave (including legs and underarms) for at least 48 hours prior to the first CHG shower.  You may shave your face/neck. Please follow these instructions carefully:  1.  Shower with CHG Soap the night before surgery and the  morning of Surgery.  2.  If you choose to wash your hair, wash your hair first as usual with your  normal  shampoo.  3.  After you shampoo, rinse your hair and body thoroughly to remove the  shampoo.                           4.  Use CHG as you would any  other liquid soap.  You can apply chg directly  to the skin and wash                       Gently with a scrungie or clean washcloth.  5.  Apply the CHG Soap to your body ONLY FROM THE NECK DOWN.   Do not use on face/ open                           Wound or open sores. Avoid contact with eyes, ears mouth and genitals (private parts).                       Wash face,  Genitals (private parts) with your normal soap.             6.  Wash thoroughly, paying special attention to the area where your surgery  will be performed.  7.  Thoroughly rinse your body with warm water from the neck down.  8.  DO NOT shower/wash with your normal soap after  using and rinsing off  the CHG Soap.                9.  Pat yourself dry with a clean towel.            10.  Wear clean pajamas.            11.  Place clean sheets on your bed the night of your first shower and do not  sleep with pets. Day of Surgery : Do not apply any lotions/deodorants the morning of surgery.  Please wear clean clothes to the hospital/surgery center.  FAILURE TO FOLLOW THESE INSTRUCTIONS MAY RESULT IN THE CANCELLATION OF YOUR SURGERY PATIENT SIGNATURE_________________________________  NURSE SIGNATURE__________________________________  ________________________________________________________________________ , coughing and deep breathing exercises, leg exercises

## 2013-10-11 NOTE — Progress Notes (Signed)
Clearance note from Dr Rayann Heman dated 09/07/13 on chart along with last office visit note.

## 2013-10-12 ENCOUNTER — Telehealth: Payer: Self-pay | Admitting: Internal Medicine

## 2013-10-12 ENCOUNTER — Encounter (HOSPITAL_COMMUNITY)
Admission: RE | Admit: 2013-10-12 | Discharge: 2013-10-12 | Disposition: A | Discharge status: Home / Self Care | Payer: 59 | Source: Ambulatory Visit | Attending: Urology | Admitting: Urology

## 2013-10-12 ENCOUNTER — Ambulatory Visit (HOSPITAL_COMMUNITY)
Admission: RE | Admit: 2013-10-12 | Discharge: 2013-10-12 | Disposition: A | Discharge status: Home / Self Care | Payer: 59 | Source: Ambulatory Visit | Attending: Urology | Admitting: Urology

## 2013-10-12 ENCOUNTER — Other Ambulatory Visit: Payer: Self-pay

## 2013-10-12 ENCOUNTER — Encounter (HOSPITAL_COMMUNITY): Payer: Self-pay

## 2013-10-12 DIAGNOSIS — I10 Essential (primary) hypertension: Secondary | ICD-10-CM | POA: Diagnosis not present

## 2013-10-12 DIAGNOSIS — Z950 Presence of cardiac pacemaker: Secondary | ICD-10-CM | POA: Insufficient documentation

## 2013-10-12 DIAGNOSIS — Z01812 Encounter for preprocedural laboratory examination: Secondary | ICD-10-CM | POA: Insufficient documentation

## 2013-10-12 DIAGNOSIS — Z01818 Encounter for other preprocedural examination: Secondary | ICD-10-CM | POA: Insufficient documentation

## 2013-10-12 HISTORY — DX: Anxiety disorder, unspecified: F41.9

## 2013-10-12 HISTORY — DX: Adverse effect of unspecified anesthetic, initial encounter: T41.45XA

## 2013-10-12 HISTORY — DX: Gastro-esophageal reflux disease without esophagitis: K21.9

## 2013-10-12 HISTORY — DX: Other complications of anesthesia, initial encounter: T88.59XA

## 2013-10-12 HISTORY — DX: Presence of cardiac pacemaker: Z95.0

## 2013-10-12 LAB — CBC
HEMATOCRIT: 42.8 % (ref 39.0–52.0)
HEMOGLOBIN: 15.2 g/dL (ref 13.0–17.0)
MCH: 32.5 pg (ref 26.0–34.0)
MCHC: 35.5 g/dL (ref 30.0–36.0)
MCV: 91.6 fL (ref 78.0–100.0)
Platelets: 148 10*3/uL — ABNORMAL LOW (ref 150–400)
RBC: 4.67 MIL/uL (ref 4.22–5.81)
RDW: 13.3 % (ref 11.5–15.5)
WBC: 4.8 10*3/uL (ref 4.0–10.5)

## 2013-10-12 LAB — COMPREHENSIVE METABOLIC PANEL
ALK PHOS: 64 U/L (ref 39–117)
ALT: 16 U/L (ref 0–53)
ANION GAP: 13 (ref 5–15)
AST: 21 U/L (ref 0–37)
Albumin: 4.1 g/dL (ref 3.5–5.2)
BILIRUBIN TOTAL: 1.5 mg/dL — AB (ref 0.3–1.2)
BUN: 14 mg/dL (ref 6–23)
CHLORIDE: 102 meq/L (ref 96–112)
CO2: 26 mEq/L (ref 19–32)
Calcium: 9.7 mg/dL (ref 8.4–10.5)
Creatinine, Ser: 0.98 mg/dL (ref 0.50–1.35)
GFR calc Af Amer: >90 mL/min (ref >90)
GFR calc non Af Amer: 87 mL/min — ABNORMAL LOW (ref >90)
GLUCOSE: 89 mg/dL (ref 70–99)
POTASSIUM: 3.9 meq/L (ref 3.7–5.3)
SODIUM: 141 meq/L (ref 137–147)
Total Protein: 7 g/dL (ref 6.0–8.3)

## 2013-10-12 NOTE — Telephone Encounter (Signed)
Walk in pt Form " BP readings" gave to [facility] 7.29.15/km

## 2013-10-12 NOTE — Progress Notes (Signed)
Last office visit with Dr Rayann Heman 05/09/2013 in EPIC.  EKG 10/12/13- EPIC CXR -10/12/13 EP[facility]  Last Device check 08/22/13 EPIC  09/28/12 ECHO- EPIC

## 2013-10-13 LAB — URINE CULTURE
Colony Count: NO GROWTH
Culture: NO GROWTH

## 2013-10-13 NOTE — Telephone Encounter (Signed)
He feels better with the readings and will call back if anything changes.  Dr Rayann Heman reviewed BP readings

## 2013-10-14 NOTE — Progress Notes (Signed)
Left message for Brent Stuart that consent order for surgery does not appear in EPIC under signed and held orders.

## 2013-10-18 NOTE — Progress Notes (Signed)
Consent order needs to be signed. Left message for Chasity Little.

## 2013-10-19 ENCOUNTER — Encounter (HOSPITAL_COMMUNITY)
Admission: RE | Disposition: A | Discharge status: Home / Self Care | Payer: Self-pay | Source: Ambulatory Visit | Attending: Urology

## 2013-10-19 ENCOUNTER — Inpatient Hospital Stay (HOSPITAL_COMMUNITY): Payer: 59 | Admitting: Registered Nurse

## 2013-10-19 ENCOUNTER — Encounter (HOSPITAL_COMMUNITY): Payer: 59 | Admitting: Registered Nurse

## 2013-10-19 ENCOUNTER — Encounter (HOSPITAL_COMMUNITY): Payer: Self-pay | Admitting: *Deleted

## 2013-10-19 ENCOUNTER — Inpatient Hospital Stay (HOSPITAL_COMMUNITY)
Admission: RE | Admit: 2013-10-19 | Discharge: 2013-10-20 | DRG: 658 | Disposition: A | Discharge status: Home / Self Care | Payer: 59 | Source: Ambulatory Visit | Attending: Urology | Admitting: Urology

## 2013-10-19 DIAGNOSIS — C649 Malignant neoplasm of unspecified kidney, except renal pelvis: Principal | ICD-10-CM | POA: Diagnosis present

## 2013-10-19 DIAGNOSIS — I10 Essential (primary) hypertension: Secondary | ICD-10-CM | POA: Diagnosis present

## 2013-10-19 DIAGNOSIS — E785 Hyperlipidemia, unspecified: Secondary | ICD-10-CM | POA: Diagnosis present

## 2013-10-19 DIAGNOSIS — N289 Disorder of kidney and ureter, unspecified: Secondary | ICD-10-CM | POA: Diagnosis present

## 2013-10-19 DIAGNOSIS — Z9049 Acquired absence of other specified parts of digestive tract: Secondary | ICD-10-CM

## 2013-10-19 DIAGNOSIS — Z8051 Family history of malignant neoplasm of kidney: Secondary | ICD-10-CM | POA: Diagnosis not present

## 2013-10-19 DIAGNOSIS — Z79899 Other long term (current) drug therapy: Secondary | ICD-10-CM | POA: Diagnosis not present

## 2013-10-19 DIAGNOSIS — G4733 Obstructive sleep apnea (adult) (pediatric): Secondary | ICD-10-CM | POA: Diagnosis present

## 2013-10-19 DIAGNOSIS — K660 Peritoneal adhesions (postprocedural) (postinfection): Secondary | ICD-10-CM | POA: Diagnosis present

## 2013-10-19 DIAGNOSIS — Z950 Presence of cardiac pacemaker: Secondary | ICD-10-CM

## 2013-10-19 DIAGNOSIS — N2889 Other specified disorders of kidney and ureter: Secondary | ICD-10-CM | POA: Diagnosis present

## 2013-10-19 HISTORY — PX: ROBOTIC ASSITED PARTIAL NEPHRECTOMY: SHX6087

## 2013-10-19 LAB — BASIC METABOLIC PANEL
ANION GAP: 13 (ref 5–15)
BUN: 15 mg/dL (ref 6–23)
CALCIUM: 8.7 mg/dL (ref 8.4–10.5)
CO2: 25 mEq/L (ref 19–32)
Chloride: 100 mEq/L (ref 96–112)
Creatinine, Ser: 1.52 mg/dL — ABNORMAL HIGH (ref 0.50–1.35)
GFR, EST AFRICAN AMERICAN: 55 mL/min — AB (ref >90)
GFR, EST NON AFRICAN AMERICAN: 48 mL/min — AB (ref >90)
Glucose, Bld: 134 mg/dL — ABNORMAL HIGH (ref 70–99)
Potassium: 4.2 mEq/L (ref 3.7–5.3)
Sodium: 138 mEq/L (ref 137–147)

## 2013-10-19 LAB — TYPE AND SCREEN
ABO/RH(D): O POS
ANTIBODY SCREEN: NEGATIVE

## 2013-10-19 LAB — ABO/RH: ABO/RH(D): O POS

## 2013-10-19 LAB — HEMOGLOBIN AND HEMATOCRIT, BLOOD
HEMATOCRIT: 40.8 % (ref 39.0–52.0)
HEMOGLOBIN: 14.1 g/dL (ref 13.0–17.0)

## 2013-10-19 SURGERY — ROBOTIC ASSITED PARTIAL NEPHRECTOMY
Anesthesia: General | Laterality: Left

## 2013-10-19 MED ORDER — BUPIVACAINE HCL (PF) 0.25 % IJ SOLN
INTRAMUSCULAR | Status: DC | PRN
  Administered 2013-10-19: 30 mL

## 2013-10-19 MED ORDER — GLYCOPYRROLATE 0.2 MG/ML IJ SOLN
INTRAMUSCULAR | Status: AC
  Filled 2013-10-19: qty 4

## 2013-10-19 MED ORDER — CLINDAMYCIN PHOSPHATE 900 MG/50ML IV SOLN
INTRAVENOUS | Status: AC
  Filled 2013-10-19: qty 50

## 2013-10-19 MED ORDER — STERILE WATER FOR IRRIGATION IR SOLN
Status: DC | PRN
Start: 2013-10-19 — End: 2013-10-19
  Administered 2013-10-19: 3000 mL

## 2013-10-19 MED ORDER — DEXAMETHASONE SODIUM PHOSPHATE 10 MG/ML IJ SOLN
INTRAMUSCULAR | Status: DC | PRN
  Administered 2013-10-19: 10 mg via INTRAVENOUS

## 2013-10-19 MED ORDER — HYDROMORPHONE HCL PF 1 MG/ML IJ SOLN
INTRAMUSCULAR | Status: DC | PRN
  Administered 2013-10-19 (×3): .4 mg via INTRAVENOUS

## 2013-10-19 MED ORDER — LACTATED RINGERS IR SOLN
Status: DC | PRN
  Administered 2013-10-19: 1000 mL

## 2013-10-19 MED ORDER — NEOSTIGMINE METHYLSULFATE 10 MG/10ML IV SOLN
INTRAVENOUS | Status: DC | PRN
  Administered 2013-10-19: 5 mg via INTRAVENOUS

## 2013-10-19 MED ORDER — MIDAZOLAM HCL 5 MG/5ML IJ SOLN
INTRAMUSCULAR | Status: DC | PRN
  Administered 2013-10-19: 2 mg via INTRAVENOUS

## 2013-10-19 MED ORDER — CISATRACURIUM BESYLATE (PF) 10 MG/5ML IV SOLN
INTRAVENOUS | Status: DC | PRN
  Administered 2013-10-19: 6 mg via INTRAVENOUS
  Administered 2013-10-19: 2 mg via INTRAVENOUS
  Administered 2013-10-19: 4 mg via INTRAVENOUS
  Administered 2013-10-19: 14 mg via INTRAVENOUS

## 2013-10-19 MED ORDER — PROMETHAZINE HCL 25 MG/ML IJ SOLN
INTRAMUSCULAR | Status: AC
  Filled 2013-10-19: qty 1

## 2013-10-19 MED ORDER — HYDROMORPHONE HCL PF 1 MG/ML IJ SOLN: 0.2500 mg | INTRAMUSCULAR | Status: DC | PRN

## 2013-10-19 MED ORDER — SODIUM CHLORIDE 0.9 % IJ SOLN
INTRAMUSCULAR | Status: AC
  Filled 2013-10-19: qty 10

## 2013-10-19 MED ORDER — CLINDAMYCIN PHOSPHATE 600 MG/50ML IV SOLN
600.0000 mg | Freq: Three times a day (TID) | INTRAVENOUS | Status: AC
  Administered 2013-10-19 – 2013-10-20 (×2): 600 mg via INTRAVENOUS
  Filled 2013-10-19 (×2): qty 50

## 2013-10-19 MED ORDER — FUROSEMIDE 10 MG/ML IJ SOLN
20.0000 mg | Freq: Once | INTRAMUSCULAR | Status: AC
  Administered 2013-10-19: 20 mg via INTRAVENOUS
  Filled 2013-10-19: qty 2

## 2013-10-19 MED ORDER — ACETAMINOPHEN 325 MG PO TABS: 650.0000 mg | ORAL_TABLET | ORAL | Status: DC | PRN

## 2013-10-19 MED ORDER — HYDROMORPHONE HCL PF 2 MG/ML IJ SOLN
INTRAMUSCULAR | Status: AC
  Filled 2013-10-19: qty 1

## 2013-10-19 MED ORDER — ALPRAZOLAM 0.25 MG PO TABS
0.2500 mg | ORAL_TABLET | Freq: Three times a day (TID) | ORAL | Status: DC | PRN
  Filled 2013-10-19: qty 1

## 2013-10-19 MED ORDER — DOCUSATE SODIUM 100 MG PO CAPS
100.0000 mg | ORAL_CAPSULE | Freq: Two times a day (BID) | ORAL | Status: DC
  Administered 2013-10-19 – 2013-10-20 (×2): 100 mg via ORAL
  Filled 2013-10-19 (×3): qty 1

## 2013-10-19 MED ORDER — SENNA 8.6 MG PO TABS
1.0000 | ORAL_TABLET | Freq: Two times a day (BID) | ORAL | Status: DC
  Administered 2013-10-20 (×2): 8.6 mg via ORAL
  Filled 2013-10-19 (×2): qty 1

## 2013-10-19 MED ORDER — HYDROMORPHONE HCL PF 1 MG/ML IJ SOLN
0.5000 mg | INTRAMUSCULAR | Status: DC | PRN
  Administered 2013-10-19 – 2013-10-20 (×4): 1 mg via INTRAVENOUS
  Filled 2013-10-19 (×5): qty 1

## 2013-10-19 MED ORDER — DIPHENHYDRAMINE HCL 12.5 MG/5ML PO ELIX: 12.5000 mg | ORAL_SOLUTION | Freq: Four times a day (QID) | ORAL | Status: DC | PRN

## 2013-10-19 MED ORDER — GLYCOPYRROLATE 0.2 MG/ML IJ SOLN
INTRAMUSCULAR | Status: DC | PRN
  Administered 2013-10-19: .8 mg via INTRAVENOUS

## 2013-10-19 MED ORDER — LIDOCAINE HCL (CARDIAC) 20 MG/ML IV SOLN
INTRAVENOUS | Status: AC
  Filled 2013-10-19: qty 5

## 2013-10-19 MED ORDER — GLYCOPYRROLATE 0.2 MG/ML IJ SOLN
INTRAMUSCULAR | Status: AC
  Filled 2013-10-19: qty 3

## 2013-10-19 MED ORDER — ACETAMINOPHEN 10 MG/ML IV SOLN
1000.0000 mg | Freq: Once | INTRAVENOUS | Status: AC
  Administered 2013-10-19: 1000 mg via INTRAVENOUS
  Filled 2013-10-19: qty 100

## 2013-10-19 MED ORDER — CISATRACURIUM BESYLATE 20 MG/10ML IV SOLN
INTRAVENOUS | Status: AC
  Filled 2013-10-19: qty 10

## 2013-10-19 MED ORDER — ONDANSETRON HCL 4 MG/2ML IJ SOLN: 4.0000 mg | INTRAMUSCULAR | Status: DC | PRN

## 2013-10-19 MED ORDER — MIDAZOLAM HCL 2 MG/2ML IJ SOLN
INTRAMUSCULAR | Status: AC
  Filled 2013-10-19: qty 2

## 2013-10-19 MED ORDER — NEOSTIGMINE METHYLSULFATE 10 MG/10ML IV SOLN
INTRAVENOUS | Status: AC
  Filled 2013-10-19: qty 1

## 2013-10-19 MED ORDER — LACTATED RINGERS IV SOLN
INTRAVENOUS | Status: DC
  Administered 2013-10-19 – 2013-10-20 (×2): via INTRAVENOUS

## 2013-10-19 MED ORDER — PHENYLEPHRINE 40 MCG/ML (10ML) SYRINGE FOR IV PUSH (FOR BLOOD PRESSURE SUPPORT)
PREFILLED_SYRINGE | INTRAVENOUS | Status: AC
  Filled 2013-10-19: qty 10

## 2013-10-19 MED ORDER — DIPHENHYDRAMINE HCL 50 MG/ML IJ SOLN: 12.5000 mg | Freq: Four times a day (QID) | INTRAMUSCULAR | Status: DC | PRN

## 2013-10-19 MED ORDER — PROPOFOL 10 MG/ML IV BOLUS
INTRAVENOUS | Status: AC
  Filled 2013-10-19: qty 20

## 2013-10-19 MED ORDER — METHOCARBAMOL 500 MG PO TABS
500.0000 mg | ORAL_TABLET | Freq: Four times a day (QID) | ORAL | Status: DC | PRN
  Administered 2013-10-20: 500 mg via ORAL
  Filled 2013-10-19: qty 1

## 2013-10-19 MED ORDER — DEXAMETHASONE SODIUM PHOSPHATE 10 MG/ML IJ SOLN
INTRAMUSCULAR | Status: AC
  Filled 2013-10-19: qty 1

## 2013-10-19 MED ORDER — MANNITOL 25 % IV SOLN: INTRAVENOUS | Status: DC | PRN

## 2013-10-19 MED ORDER — MANNITOL 25 % IV SOLN
25.0000 g | Freq: Once | INTRAVENOUS | Status: AC
  Administered 2013-10-19 (×2): 12.5 g via INTRAVENOUS
  Filled 2013-10-19: qty 100

## 2013-10-19 MED ORDER — PROPOFOL 10 MG/ML IV BOLUS
INTRAVENOUS | Status: DC | PRN
  Administered 2013-10-19: 200 mg via INTRAVENOUS

## 2013-10-19 MED ORDER — ONDANSETRON HCL 4 MG/2ML IJ SOLN
INTRAMUSCULAR | Status: AC
  Filled 2013-10-19: qty 2

## 2013-10-19 MED ORDER — CLINDAMYCIN PHOSPHATE 900 MG/50ML IV SOLN
900.0000 mg | Freq: Once | INTRAVENOUS | Status: AC
  Administered 2013-10-19: 900 mg via INTRAVENOUS

## 2013-10-19 MED ORDER — BUPIVACAINE LIPOSOME 1.3 % IJ SUSP
INTRAMUSCULAR | Status: DC | PRN
  Administered 2013-10-19: 20 mL

## 2013-10-19 MED ORDER — FENTANYL CITRATE 0.05 MG/ML IJ SOLN
INTRAMUSCULAR | Status: AC
  Filled 2013-10-19: qty 5

## 2013-10-19 MED ORDER — LACTATED RINGERS IV SOLN
INTRAVENOUS | Status: DC
  Administered 2013-10-19 (×2): via INTRAVENOUS
  Administered 2013-10-19: 1000 mL via INTRAVENOUS

## 2013-10-19 MED ORDER — PROMETHAZINE HCL 25 MG/ML IJ SOLN
6.2500 mg | INTRAMUSCULAR | Status: DC | PRN
  Administered 2013-10-19: 6.25 mg via INTRAVENOUS

## 2013-10-19 MED ORDER — OXYCODONE HCL 5 MG PO TABS
5.0000 mg | ORAL_TABLET | ORAL | Status: DC | PRN
  Administered 2013-10-20 (×2): 5 mg via ORAL
  Filled 2013-10-19 (×2): qty 1

## 2013-10-19 MED ORDER — PHENYLEPHRINE HCL 10 MG/ML IJ SOLN
INTRAMUSCULAR | Status: DC | PRN
  Administered 2013-10-19 (×2): 40 ug via INTRAVENOUS

## 2013-10-19 MED ORDER — ROCURONIUM BROMIDE 100 MG/10ML IV SOLN
INTRAVENOUS | Status: AC
  Filled 2013-10-19: qty 1

## 2013-10-19 MED ORDER — FENTANYL CITRATE 0.05 MG/ML IJ SOLN
INTRAMUSCULAR | Status: DC | PRN
  Administered 2013-10-19: 100 ug via INTRAVENOUS
  Administered 2013-10-19 (×3): 50 ug via INTRAVENOUS

## 2013-10-19 MED ORDER — METOPROLOL TARTRATE 25 MG PO TABS
25.0000 mg | ORAL_TABLET | Freq: Two times a day (BID) | ORAL | Status: DC
  Administered 2013-10-19 – 2013-10-20 (×2): 25 mg via ORAL
  Filled 2013-10-19 (×3): qty 1

## 2013-10-19 MED ORDER — BUPIVACAINE LIPOSOME 1.3 % IJ SUSP
20.0000 mL | Freq: Once | INTRAMUSCULAR | Status: DC
  Filled 2013-10-19: qty 20

## 2013-10-19 MED ORDER — HEPARIN SODIUM (PORCINE) 5000 UNIT/ML IJ SOLN
5000.0000 [IU] | INTRAMUSCULAR | Status: AC
  Administered 2013-10-19: 5000 [IU] via SUBCUTANEOUS
  Filled 2013-10-19: qty 1

## 2013-10-19 MED ORDER — LIDOCAINE HCL (CARDIAC) 20 MG/ML IV SOLN
INTRAVENOUS | Status: DC | PRN
  Administered 2013-10-19: 80 mg via INTRAVENOUS

## 2013-10-19 MED ORDER — BUPIVACAINE-EPINEPHRINE (PF) 0.25% -1:200000 IJ SOLN
INTRAMUSCULAR | Status: AC
  Filled 2013-10-19: qty 30

## 2013-10-19 MED ORDER — SODIUM CHLORIDE 0.9 % IJ SOLN
INTRAMUSCULAR | Status: AC
  Filled 2013-10-19: qty 30

## 2013-10-19 SURGICAL SUPPLY — 64 items
APPLICATOR SURGIFLO ENDO (HEMOSTASIS) IMPLANT
CABLE HIGH FREQUENCY MONO STRZ (ELECTRODE) ×2 IMPLANT
CHLORAPREP W/TINT 26ML (MISCELLANEOUS) ×2 IMPLANT
CLIP LIGATING HEM O LOK PURPLE (MISCELLANEOUS) ×2 IMPLANT
CLIP LIGATING HEMO LOK XL GOLD (MISCELLANEOUS) IMPLANT
CLIP LIGATING HEMO O LOK GREEN (MISCELLANEOUS) ×12 IMPLANT
CORDS BIPOLAR (ELECTRODE) ×2 IMPLANT
COVER SURGICAL LIGHT HANDLE (MISCELLANEOUS) ×2 IMPLANT
COVER TIP SHEARS 8 DVNC (MISCELLANEOUS) ×1 IMPLANT
COVER TIP SHEARS 8MM DA VINCI (MISCELLANEOUS) ×1
DECANTER SPIKE VIAL GLASS SM (MISCELLANEOUS) ×2 IMPLANT
DERMABOND ADVANCED (GAUZE/BANDAGES/DRESSINGS) ×1
DERMABOND ADVANCED .7 DNX12 (GAUZE/BANDAGES/DRESSINGS) ×1 IMPLANT
DRAIN CHANNEL 15F RND FF 3/16 (WOUND CARE) ×2 IMPLANT
DRAPE INCISE IOBAN 66X45 STRL (DRAPES) ×2 IMPLANT
DRAPE LAPAROSCOPIC ABDOMINAL (DRAPES) ×2 IMPLANT
DRAPE LG THREE QUARTER DISP (DRAPES) ×4 IMPLANT
DRAPE TABLE BACK 44X90 PK DISP (DRAPES) ×2 IMPLANT
DRAPE WARM FLUID 44X44 (DRAPE) ×2 IMPLANT
ELECT REM PT RETURN 9FT ADLT (ELECTROSURGICAL) ×2
ELECTRODE REM PT RTRN 9FT ADLT (ELECTROSURGICAL) ×1 IMPLANT
EVACUATOR SILICONE 100CC (DRAIN) ×2 IMPLANT
GLOVE BIO SURGEON STRL SZ 6.5 (GLOVE) IMPLANT
GLOVE BIOGEL M STRL SZ7.5 (GLOVE) ×4 IMPLANT
GOWN STRL REUS W/TWL LRG LVL3 (GOWN DISPOSABLE) ×12 IMPLANT
GOWN STRL REUS W/TWL XL LVL3 (GOWN DISPOSABLE) IMPLANT
HEMOSTAT SURGICEL 2X14 (HEMOSTASIS) ×2 IMPLANT
KIT ACCESSORY DA VINCI DISP (KITS) ×1
KIT ACCESSORY DVNC DISP (KITS) ×1 IMPLANT
KIT BASIN OR (CUSTOM PROCEDURE TRAY) ×2 IMPLANT
LOOP VESSEL MAXI BLUE (MISCELLANEOUS) ×2 IMPLANT
NEEDLE INSUFFLATION 14GA 120MM (NEEDLE) ×2 IMPLANT
NS IRRIG 1000ML POUR BTL (IV SOLUTION) IMPLANT
PENCIL BUTTON HOLSTER BLD 10FT (ELECTRODE) ×2 IMPLANT
POSITIONER SURGICAL ARM (MISCELLANEOUS) ×4 IMPLANT
POUCH SPECIMEN RETRIEVAL 10MM (ENDOMECHANICALS) ×2 IMPLANT
SCISSORS LAP 5X35 DISP (ENDOMECHANICALS) ×2 IMPLANT
SET TUBE IRRIG SUCTION NO TIP (IRRIGATION / IRRIGATOR) ×2 IMPLANT
SOLUTION ANTI FOG 6CC (MISCELLANEOUS) ×2 IMPLANT
SOLUTION ELECTROLUBE (MISCELLANEOUS) ×2 IMPLANT
SPONGE DRAIN TRACH 4X4 STRL 2S (GAUZE/BANDAGES/DRESSINGS) ×2 IMPLANT
SPONGE LAP 4X18 X RAY DECT (DISPOSABLE) IMPLANT
SURGIFLO W/THROMBIN 8M KIT (HEMOSTASIS) ×2 IMPLANT
SUT ETHILON 3 0 PS 1 (SUTURE) ×2 IMPLANT
SUT MNCRL AB 4-0 PS2 18 (SUTURE) ×4 IMPLANT
SUT PDS AB 1 CT1 27 (SUTURE) ×2 IMPLANT
SUT V-LOC BARB 180 2/0GR6 GS22 (SUTURE)
SUT V-LOC BARB 180 2/0GR9 GS23 (SUTURE) ×4
SUT VICRYL 0 UR6 27IN ABS (SUTURE) ×2 IMPLANT
SUT VLOC BARB 180 ABS3/0GR12 (SUTURE) ×4
SUTURE V-LC BRB 180 2/0GR6GS22 (SUTURE) IMPLANT
SUTURE V-LC BRB 180 2/0GR9GS23 (SUTURE) ×2 IMPLANT
SUTURE VLOC BRB 180 ABS3/0GR12 (SUTURE) ×2 IMPLANT
SYR BULB IRRIGATION 50ML (SYRINGE) IMPLANT
TOWEL OR 17X26 10 PK STRL BLUE (TOWEL DISPOSABLE) ×4 IMPLANT
TOWEL OR NON WOVEN STRL DISP B (DISPOSABLE) ×4 IMPLANT
TRAY FOLEY CATH 14FRSI W/METER (CATHETERS) IMPLANT
TRAY FOLEY CATH 16FRSI W/METER (SET/KITS/TRAYS/PACK) ×2 IMPLANT
TRAY LAP CHOLE (CUSTOM PROCEDURE TRAY) ×2 IMPLANT
TROCAR 12M 150ML BLUNT (TROCAR) ×2 IMPLANT
TROCAR XCEL 12X100 BLDLESS (ENDOMECHANICALS) ×2 IMPLANT
TROCAR XCEL NON-BLD 5MMX100MML (ENDOMECHANICALS) ×2 IMPLANT
TUBING INSUFFLATION 10FT LAP (TUBING) ×2 IMPLANT
WATER STERILE IRR 1500ML POUR (IV SOLUTION) ×4 IMPLANT

## 2013-10-19 NOTE — Brief Op Note (Signed)
10/19/2013  6:08 PM  PATIENT:  Brent Stuart  63 y.o. male  PRE-OPERATIVE DIAGNOSIS:  LEFT RENAL MASS   POST-OPERATIVE DIAGNOSIS:  LEFT RENAL MASS   PROCEDURE:  Procedure(s): ROBOTIC ASSISTED LAPAROSCOPIC LEFT PARTIAL NEPHRECTOMY, EXSTENSIVE LAPAROSCOPIC LYSIS OF ADHESIONS (Left)  SURGEON:  Surgeon(s) and Role:    * Ardis Hughs, MD - Primary    * Alexis Frock, MD - Assisting  Resident PHYSICIAN ASSISTANT:  Dr. Marcelino Scot  ASSISTANTS: none   ANESTHESIA:   general  EBL:  Total I/O In: 2000 [I.V.:2000] Out: 335 [Urine:315; Blood:20]  BLOOD ADMINISTERED:none  DRAINS: (LLQ) Jackson-Pratt drain(s) with closed bulb suction in the LLQ and Urinary Catheter (Foley)   LOCAL MEDICATIONS USED:  MARCAINE   , Amount: 20cc  ml and OTHER exparel 40cc  SPECIMEN:  Left lower pole mass  DISPOSITION OF SPECIMEN:  PATHOLOGY  COUNTS:  YES  TOURNIQUET:  * No tourniquets in log *  DICTATION: .Dragon Dictation  PLAN OF CARE: Admit to inpatient   PATIENT DISPOSITION:  PACU - hemodynamically stable.   Delay start of Pharmacological VTE agent (>24hrs) due to surgical blood loss or risk of bleeding: yes

## 2013-10-19 NOTE — Transfer of Care (Signed)
Immediate Anesthesia Transfer of Care Note  Patient: Brent Stuart  Procedure(s) Performed: Procedure(s): ROBOTIC ASSISTED LAPAROSCOPIC LEFT PARTIAL NEPHRECTOMY, EXSTENSIVE LAPAROSCOPIC LYSIS OF ADHESIONS (Left)  Patient Location: PACU  Anesthesia Type:General  Level of Consciousness: awake, alert  and oriented  Airway & Oxygen Therapy: Patient Spontanous Breathing and Patient connected to face mask oxygen  Post-op Assessment: Report given to PACU RN and Post -op Vital signs reviewed and stable  Post vital signs: Reviewed and stable  Complications: No apparent anesthesia complications

## 2013-10-19 NOTE — Op Note (Addendum)
Preoperative diagnosis:  1. Left lower pole renal mass   Postoperative diagnosis:  1. same   Procedure: 1. Lysis of abdominal adhesions ~30 minutes 2. Intraoperative renal ultrasound 3. Robotic assisted laparoscopic left partial nephrectomy  Surgeon: Ardis Hughs, MD Assistant: Dr. Phebe Colla, MD Resident Urologist: Dr. Marcelino Scot, MD  Anesthesia: General  Complications: None  Intraoperative findings: Small endophytic renal mass located on the anterior surface of the lower pole of the kidney.  Margins were determined using intraoperative ultrasound.  Clamp time, warm ischemia time, was ~25 minutes.  EBL: 20cc  Specimens: left lower pole renal mass  Indication: Brent Stuart is a 62 y.o. patient who was found to have an incidental left renal mass during a workup for abdominal pain.  After reviewing the management options for treatment, he elected to proceed with the above surgical procedure(s). We have discussed the potential benefits and risks of the procedure, side effects of the proposed treatment, the likelihood of the patient achieving the goals of the procedure, and any potential problems that might occur during the procedure or recuperation. Informed consent has been obtained.  Description of procedure: The patient was taken back to the operating room placed on the table in supine position. General anesthesia was then induced and endotracheal tube inserted. A Foley catheter was then inserted as well. Patient was then placed in the right lateral decubitus position, was then secured to the table with a beanbag, axillary roll placed on the right axilla and the lower extremities were built up with pillows. All bony prominences were padded and the patient was taped at the chest, hips and knees. The table was then flexed so as to open up the costo-vertebral angle.   A Veress needle was then inserted lateral to the umbilicus and the abdomen inflated to 15 mmHg. A 5 mm  Visiport trocar was then passed just lateral to the umbilicus using the 5 mm laparoscopic camera under direct visualization. We then placed the two 8 mm robotic ports under visual guidance. The first port is located just below the costal margin lateral to the xiphoid process and a second port placed in the right lower quadrant so as to triangulate onto the renal hilum. An 12 mm assistant port was above the camera port through the umbilicus.  The 5 mm Visiport trocar was then exchanged for a 12 mm port which was used for the third robotic camera. The robot was then docked, in the left hand a bipolar fenestrated grasper was placed and in the right hand monopolar scissors were placed. The third robotic port was fenestrated grasper which was used to retract the kidney throughout the case.   I started the case by taking down the adhesions of the omentum and descending colon off the anterior abdominal wall.  The adhesions ran from the splenic flexure down to the pelvis. Once the adhesions were taken down I was able to dissect the descending colon medially down into the pelvis. The ureter was identified at the lower pole of the left kidney. Further dissection of the ureter from the underlying psoas allowed the ureter to be lifted up using the third robotic arm which allowed lift of the kidney and facilitated dissection of the hilum. We then turned our attention to the renal hilum and dissected out the main renal vein which was branched early. A vesi-loop was then passed around both of these vessels and Weck clip placed on the vessel loop. This was then used to retract the  vein inferiorly which helped facilitate the dissection of the renal artery. The renal artery was then dissected freely and I was able to get a vessel loop around the base of the renal artery as well.   Once the hilum had been identified and secured attention was turned to the lower pole of the kidney. Gerotas fat was dissected off the anterior aspect  of the lower pole of the kidney and around the mass.  A small vein noted to be entering the mass on the the kidney from the posterior branch of the Renal vein was clipped and divided to prevent inadvertant bleeding.  The mass was largely endophytic, and the margins difficult to delineate.  We opted to use as the intraoperative ultrasound to help identify borders of the mass.  The mass was identified by scanning the anterior aspect of the kidney starting at the renal hilum and moving down overtop the kidney towards the lower pole.  The margin identified on the anterior superior aspect was noted to be adjacent to the sinus fat on ultrasound.  We used the ultrasound to demarcate the margins which were marked with the electrocautery.  Once the mass was identified, the fat surrounding the top mass was excised and sent to pathology.  The remaining fat was dissected off of the surface of the surrounding parenchyma.  Once the fat had been dissected off the surface of the kidney around the lesion, the lesion was marked 360 around with cautery and used to delineate the margin around the mass. 12.5 mg of mannitol was administered. 2 bulldog clamps were then placed across the renal artery and the dissection ensued of the renal mass. This was done using the scissors sharply without cautery. Once the mass had been completely excised it was placed aside and the defect was closed in 3 layers. The first layer was closed with a 3-0 suture in a running fashion within the base of the resection, closing the collecting system and vasculature. The ends of the V-loc were secured to either end of the defect with a Weck clip. A second V-loc suture was used to provide additional hemostasis within the dissection bed. The third layer was closed with a 2-0 V-loc suture in an interrupted fashion reapproximating the edges and creating tension by cinching the sutures down with Weck clips. Total clamp time was 25 minutes. Once the second layer  of suture had been completed the bulldogs released the renal artery there was no secondary or ongoing bleeding. An additional 12.5mg  of mannitol was given once the clamps were removed. FloSeal was then squirted into the region of the resected mass and the remaining gerota's fat was then stretched over the defect and secured with Weck clips. The mass was then placed in an Endo Catch bag. The string from the Endo Catch bag was then pulled through the assistant port. The Vesseloops were then removed from the patient. A drain was then passed through the third port and positioned around the resected area.   Then using a spinal needle we injected the patient along the anterior axillary line in the plane in between the transversus abdominis and the internal oblique muscles in the region of the musculocutaneous nervesand with a total of 40 cc of 2.5% ropivacaine in for separate injections. This was done under direct vision using the robotic camera.   The camera port was then closed using a 0-vicryl on fascia. The ports were then all removed. The specimen was removed through the assistant port without  a significant need for dilating the fascia. Once the specimen was removed the fascia was then approximated with 0 Vicryl in 2 interrupted stitches. All skin incisions were then closed with a 4-0 Monocryl in a subcutaneous fashion. The drain was secured with a 3-0 nylon. Dermabond was then applied to the incisions. The patient was subsequently extubated and returned to the PACU in excellent condition. All laps, needles and sponges were accounted for at the end of case.     Ardis Hughs, M.D.

## 2013-10-19 NOTE — Anesthesia Postprocedure Evaluation (Signed)
  Anesthesia Post-op Note  Patient: Brent Stuart  Procedure(s) Performed: Procedure(s) (LRB): ROBOTIC ASSISTED LAPAROSCOPIC LEFT PARTIAL NEPHRECTOMY, EXSTENSIVE LAPAROSCOPIC LYSIS OF ADHESIONS (Left)  Patient Location: PACU  Anesthesia Type: General  Level of Consciousness: awake and alert   Airway and Oxygen Therapy: Patient Spontanous Breathing  Post-op Pain: mild  Post-op Assessment: Post-op Vital signs reviewed, Patient's Cardiovascular Status Stable, Respiratory Function Stable, Patent Airway and No signs of Nausea or vomiting  Last Vitals:  Filed Vitals:   10/19/13 1845  BP: 144/75  Pulse:   Temp:   Resp: 10    Post-op Vital Signs: stable   Complications: No apparent anesthesia complications

## 2013-10-19 NOTE — Interval H&P Note (Signed)
History and Physical Interval Note:  10/19/2013 12:02 PM  Brent Stuart  has presented today for surgery, with the diagnosis of LEFT RENAL MASS   The various methods of treatment have been discussed with the patient and family. After consideration of risks, benefits and other options for treatment, the patient has consented to  Procedure(s): ROBOTIC ASSISTED LAPAROSCOPIC LEFT PARTIAL NEPHRECTOMY (Left) as a surgical intervention .  The patient's history has been reviewed, patient examined, no change in status, stable for surgery.  I have reviewed the patient's chart and labs.  Questions were answered to the patient's satisfaction.     Louis Meckel W

## 2013-10-19 NOTE — Anesthesia Preprocedure Evaluation (Addendum)
Anesthesia Evaluation  Patient identified by MRN, date of birth, ID band Patient awake    Reviewed: Allergy & Precautions, H&P , NPO status , Patient's Chart, lab work & pertinent test results  Airway Mallampati: II TM Distance: >3 FB Neck ROM: Full    Dental no notable dental hx.    Pulmonary sleep apnea and Continuous Positive Airway Pressure Ventilation ,  breath sounds clear to auscultation  Pulmonary exam normal       Cardiovascular hypertension, Pt. on medications and Pt. on home beta blockers + dysrhythmias + pacemaker Rhythm:Regular Rate:Normal  Left ventricle: The cavity size was normal. Wall thickness   was increased in a pattern of mild LVH. There was mild   concentric hypertrophy. Systolic function was normal. The   estimated ejection fraction was in the range of 55% to   60%. Wall motion was normal; there were no regional wall   motion abnormalities. Doppler parameters are consistent   with abnormal left ventricular relaxation (grade 1   diastolic dysfunction).    Neuro/Psych negative neurological ROS  negative psych ROS   GI/Hepatic negative GI ROS, Neg liver ROS,   Endo/Other  negative endocrine ROS  Renal/GU negative Renal ROS  negative genitourinary   Musculoskeletal negative musculoskeletal ROS (+)   Abdominal   Peds negative pediatric ROS (+)  Hematology negative hematology ROS (+)   Anesthesia Other Findings   Reproductive/Obstetrics negative OB ROS                         Anesthesia Physical Anesthesia Plan  ASA: III  Anesthesia Plan: General   Post-op Pain Management:    Induction: Intravenous  Airway Management Planned: Oral ETT  Additional Equipment:   Intra-op Plan:   Post-operative Plan: Extubation in OR  Informed Consent: I have reviewed the patients History and Physical, chart, labs and discussed the procedure including the risks, benefits and  alternatives for the proposed anesthesia with the patient or authorized representative who has indicated his/her understanding and acceptance.   Dental advisory given  Plan Discussed with: CRNA and Surgeon  Anesthesia Plan Comments:         Anesthesia Quick Evaluation

## 2013-10-19 NOTE — H&P (Signed)
Reason For Visit Left renal mass   History of Present Illness This 63 year old male referred by Dr. Jenny Reichmann, M.D. for evaluation and management of an incidental left renal mass.  The patient presented to his PCP complaining of left lower quadrant pain, which appeared to be GI in nature. A CT scan was obtained. He did have an incidental finding of a left renal mass. This 2.6 cm in size and located on the anterior aspect of the intra-pole/lower pole of the left kidney. Patient has one artery and one vein. The contralateral kidney appears to be normal. Patient's creatinine is noted to be 1.13 this most recent labs.   The patient's abdominal pain is improving, he thinks this is likely secondary to diverticulitis. The patient has a past medical history significant for exploratory laparotomy and a right hemicolectomy for fulminant appendicitis. He also has a cardiac pacemaker which was implanted in 2005 for syncope. The patient is otherwise healthy. Additionally, he does have a family history of renal cancer in his maternal grandfather and his maternal uncle.   Past Medical History Problems  1. History of Blow out fracture of orbit (802.6) 2. History of Chronic insomnia (780.52) 3. History of Diverticulosis (562.10) 4. History of depression (V11.8) 5. History of hyperlipidemia (V12.29) 6. History of hypertension (V12.59) 7. History of irritable bowel syndrome (V12.79) 8. History of migraine headaches (V12.49) 9. History of osteoarthritis (V13.4) 10. History of syncope (V15.89) 11. History of Obstructive sleep apnea (327.23) 12. History of Plantar fasciitis (728.71)  Current Meds 1. ALPRAZolam 0.25 MG Oral Tablet;  Therapy: (Recorded:11Jun2015) to Recorded 2. Diovan 80 MG Oral Tablet;  Therapy: (Recorded:11Jun2015) to Recorded 3. Excedrin Migraine TABS;  Therapy: (Recorded:11Jun2015) to Recorded 4. Lansoprazole 30 MG Oral Capsule Delayed Release;  Therapy: (Recorded:11Jun2015) to Recorded 5.  Methocarbamol 500 MG Oral Tablet;  Therapy: (Recorded:11Jun2015) to Recorded 6. Metoprolol Tartrate 25 MG Oral Tablet;  Therapy: (Recorded:11Jun2015) to Recorded  Allergies Medication  1. Penicillins 2. Zoloft TABS 3. Septra TABS  Family History Problems  1. Family history of aortic stenosis (V17.49) : Mother 2. Family history of chronic obstructive pulmonary disease (V17.6) : Father 3. 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 No acute distress.  ENT:. The ears and nose are normal in appearance.  Neck: The appearance of the neck is normal and no neck mass is present.  Pulmonary: No respiratory distress, normal respiratory rhythm and effort and clear bilateral breath sounds.  Cardiovascular: Heart rate and rhythm are normal . No peripheral edema. No obvious murmurs are appreciated.  Abdomen: The abdomen is soft and nontender. No masses are palpated. No CVA tenderness. No hernias are palpable. No hepatosplenomegaly noted. Lower midline incision, well-healed. He does have a laparoscopic scar in the left subcostal region.  Rectal: The prostate exam was deferred.  Lymphatics: The femoral and inguinal nodes are not enlarged or tender.  Skin: Normal skin turgor, no visible rash and no visible skin lesions.  Neuro/Psych:. Mood and affect are appropriate.    Assessment 2.6 cm left renal mass,  concerning for renal cell carcinoma.   Plan Left renal mass  1. Follow-up Schedule Surgery Office  Follow-up  Status: Complete  Done:  [id number]  Discussion/Summary  The patient was provided information regarding their renal mass including the relative risk of benign versus malignant pathology and the natural history of renal cell carcinoma and other possible malignancies of the kidney. The role of renal biopsy, laboratory testing, and imaging studies to further characterize renal masses and/or the presence of metastatic disease were explained. We discussed the role of active surveillance, surgical therapy with both radical nephrectomy and nephron-sparing surgery, and ablative therapy in the treatment of renal masses. In addition, we discussed our goals of providing an accurate diagnosis and oncologic control while maintaining optimal renal function as appropriate based on the size, location, and complexity of their renal mass as well as their co-morbidities.    We have discussed the risks of treatment in detail including but not limited to bleeding, infection, heart attack, stroke, death, venothromoboembolism, cancer recurrence, injury/damage to surrounding organs and structures, urine leak, the possibility of open surgical conversion for patients undergoing minimally invasive surgery, the risk of developing chronic kidney disease and its associated implications, and the potential risk of end stage renal disease possibly necessitating dialysis.   I went over the robotic-assisted laparoscopic partial nephrectomy approach. I described for the patient the procedure in detail including port placement. I detailed the postoperative course including the fact that the patient would have both a drain and a Foley catheter following the surgery. I told him that most often patients are discharged on postoperative day one or 2. I then detailed the expected recovery time, I told him that he would not be able to lift anything greater than 20 pounds for 4 weeks. I also went over the risks and benefits of this operation in great detail. We discussed the  risk of injury to surrounding structures, major blood vessels and nerves, bleeding, infection, loss of kidney, and the risk of recurrent cancer.   We will get this scheduled over the next 6 weeks or so. The patient has several things that he might take care of the summer prior to undergoing surgery.

## 2013-10-20 ENCOUNTER — Encounter (HOSPITAL_COMMUNITY): Payer: Self-pay | Admitting: Urology

## 2013-10-20 LAB — BASIC METABOLIC PANEL
ANION GAP: 11 (ref 5–15)
BUN: 17 mg/dL (ref 6–23)
CALCIUM: 8.7 mg/dL (ref 8.4–10.5)
CO2: 26 mEq/L (ref 19–32)
Chloride: 100 mEq/L (ref 96–112)
Creatinine, Ser: 1.33 mg/dL (ref 0.50–1.35)
GFR calc non Af Amer: 56 mL/min — ABNORMAL LOW (ref >90)
GFR, EST AFRICAN AMERICAN: 65 mL/min — AB (ref >90)
Glucose, Bld: 124 mg/dL — ABNORMAL HIGH (ref 70–99)
Potassium: 4.3 mEq/L (ref 3.7–5.3)
Sodium: 137 mEq/L (ref 137–147)

## 2013-10-20 LAB — CREATININE, FLUID (PLEURAL, PERITONEAL, JP DRAINAGE): CREAT FL: 1.2 mg/dL

## 2013-10-20 LAB — CBC
HCT: 37.9 % — ABNORMAL LOW (ref 39.0–52.0)
Hemoglobin: 13.2 g/dL (ref 13.0–17.0)
MCH: 32.6 pg (ref 26.0–34.0)
MCHC: 34.8 g/dL (ref 30.0–36.0)
MCV: 93.6 fL (ref 78.0–100.0)
Platelets: 127 10*3/uL — ABNORMAL LOW (ref 150–400)
RBC: 4.05 MIL/uL — ABNORMAL LOW (ref 4.22–5.81)
RDW: 13.5 % (ref 11.5–15.5)
WBC: 7.5 10*3/uL (ref 4.0–10.5)

## 2013-10-20 MED ORDER — DSS 100 MG PO CAPS: 100.0000 mg | ORAL_CAPSULE | Freq: Two times a day (BID) | ORAL | Status: DC

## 2013-10-20 MED ORDER — OXYCODONE HCL 5 MG PO TABS: 5.0000 mg | ORAL_TABLET | ORAL | Status: DC | PRN

## 2013-10-20 NOTE — Discharge Instructions (Signed)

## 2013-10-20 NOTE — Progress Notes (Signed)
Discharge paperwork reviewed and signed with pt and wife.  They verbalize understanding of all instructions and deny any further questions.  Discharged home in stable condition.  Coolidge Breeze, RN 10/20/2013

## 2013-10-20 NOTE — Progress Notes (Signed)
I have seen and examined the patient.  I agree with the residents assessment and plan.  Foley out, then check JP creatinine.  If consistent with serum we will remove it.  Once he has met discharge criteria he can go home, expect that would be this PM.

## 2013-10-20 NOTE — Progress Notes (Signed)
Spoke with Dr. Louis Meckel at bedside.  Orders received to d/c Foley catheter, wait 2 hrs, then send JP fluid for creatinine.  Foley d/c'd, pt tolerated well.  Coolidge Breeze, RN 10/20/2013

## 2013-10-20 NOTE — Progress Notes (Signed)
1 Day Post-Op Subjective: No complaints. Had a great night.  Objective: Vital signs in last 24 hours: Temp:  [97.4 F (36.3 C)-98.1 F (36.7 C)] 97.7 F (36.5 C) (08/06 0435) Pulse Rate:  [69-88] 69 (08/06 0435) Resp:  [10-18] 16 (08/06 0435) BP: (112-144)/(64-90) 113/67 mmHg (08/06 0435) SpO2:  [100 %] 100 % (08/06 0435) Weight:  [94.802 kg (209 lb)-95.3 kg (210 lb 1.6 oz)] 95.3 kg (210 lb 1.6 oz) (08/05 1955)  Intake/Output from previous day: 08/05 0701 - 08/06 0700 In: 3671.7 [P.O.:480; I.V.:3091.7; IV Piggyback:100] Out: 5537 [Urine:2310; Drains:145; Blood:20] Intake/Output this shift:    Physical Exam:  General: Alert and oriented CV: RRR Lungs: Clear Abdomen: Soft, ND Incisions: CDI. Foley clear yellow. JP serosang, thin Ext: NT, No erythema  Lab Results:  Recent Labs  10/19/13 1848 10/20/13 0350  HGB 14.1 13.2  HCT 40.8 37.9*   BMET  Recent Labs  10/19/13 1848 10/20/13 0350  NA 138 137  K 4.2 4.3  CL 100 100  CO2 25 26  GLUCOSE 134* 124*  BUN 15 17  CREATININE 1.52* 1.33  CALCIUM 8.7 8.7     Studies/Results: No results found.  Assessment/Plan: POD#1 from uncomplicated L RAPN. Doing well.  [place] [facility] diet D/c foley. Check JP cre 2 hrs after foley removed. Ambulate.  If tolerating all of above, may discharge home after lunch.  Discussed with Dr. Louis Meckel.   LOS: 1 day   Margo Aye 10/20/2013, 7:17 AM

## 2013-10-20 NOTE — Discharge Summary (Signed)
Date of admission: 10/19/2013  Date of discharge: 10/20/2013  Admission diagnosis: left lower pole renal mass  Discharge diagnosis: same  Secondary diagnoses:  Patient Active Problem List   Diagnosis Date Noted  . Renal mass 10/19/2013  . Neurocardiogenic syncope 05/09/2013  . Sick sinus syndrome 05/09/2013  . Essential hypertension 05/09/2013  . Morbid obesity 05/09/2013  . Sleep apnea 05/09/2013    History and Physical: For full details, please see admission history and physical. Briefly, Brent Stuart is a 63 y.o. year old patient with incidental finding of left renal mass found during a work-up for abdominal pain.   Hospital Course: Patient tolerated the procedure well.  He was then transferred to the floor after an uneventful PACU stay.  His hospital course was uncomplicated.  On POD#1  he had met discharge criteria: was eating a regular diet, was up and ambulating independently,  pain was well controlled, was voiding without a catheter, and was ready to for discharge.   Laboratory values:   Recent Labs  10/19/13 1848 10/20/13 0350  WBC  --  7.5  HGB 14.1 13.2  HCT 40.8 37.9*    Recent Labs  10/19/13 1848 10/20/13 0350  NA 138 137  K 4.2 4.3  CL 100 100  CO2 25 26  GLUCOSE 134* 124*  BUN 15 17  CREATININE 1.52* 1.33  CALCIUM 8.7 8.7   No results found for this basename: LABPT, INR,  in the last 72 hours No results found for this basename: LABURIN,  in the last 72 hours Results for orders placed during the hospital encounter of 10/12/13  URINE CULTURE     Status: None   Collection Time    10/12/13  2:16 PM      Result Value Ref Range Status   Specimen Description URINE, CLEAN CATCH   Final   Special Requests NONE   Final   Culture  Setup Time     Final   Value: 10/12/2013 19:49     Performed at [facility]     Final   Value: NO GROWTH     Performed at [facility]   Culture     Final   Value: NO GROWTH     Performed  at [facility]   Report Status 10/13/2013 FINAL   Final    Disposition: Home  Discharge instruction: The patient was instructed to be ambulatory but told to refrain from heavy lifting, strenuous activity, or driving.   Discharge medications:    Medication List         ALPRAZolam 0.25 MG tablet  Commonly known as:  XANAX  Take 0.25 mg by mouth 3 (three) times daily as needed for anxiety.     DSS 100 MG Caps  Take 100 mg by mouth 2 (two) times daily.     methocarbamol 500 MG tablet  Commonly known as:  ROBAXIN  Take 500 mg by mouth every 6 (six) hours as needed for muscle spasms.     metoprolol tartrate 25 MG tablet  Commonly known as:  LOPRESSOR  Take 1 tablet (25 mg total) by mouth 2 (two) times daily.     oxyCODONE 5 MG immediate release tablet  Commonly known as:  Oxy IR/ROXICODONE  Take 1-2 tablets (5-10 mg total) by mouth every 4 (four) hours as needed for moderate pain.     valsartan 80 MG tablet  Commonly known as:  DIOVAN  Take 80 mg by  mouth every morning. LAST DOSE 10/06/2013        Followup:      Follow-up Information   Follow up with Ardis Hughs, MD On 11/01/2013.   Specialty:  Urology   Contact information:   [place] [place] [address] [phone]

## 2013-11-15 ENCOUNTER — Telehealth: Payer: Self-pay | Admitting: Cardiology

## 2013-11-15 ENCOUNTER — Ambulatory Visit (INDEPENDENT_AMBULATORY_CARE_PROVIDER_SITE_OTHER): Payer: 59 | Admitting: *Deleted

## 2013-11-15 DIAGNOSIS — I495 Sick sinus syndrome: Secondary | ICD-10-CM

## 2013-11-15 DIAGNOSIS — Z950 Presence of cardiac pacemaker: Secondary | ICD-10-CM

## 2013-11-15 LAB — MDC_IDC_ENUM_SESS_TYPE_REMOTE
Battery Remaining Longevity: -1 mo
Battery Voltage: 2.65 V
Date Time Interrogation Session: 20150901164416
Lead Channel Impedance Value: 67 Ohm
Lead Channel Setting Pacing Amplitude: 5 V
Lead Channel Setting Sensing Sensitivity: 2.8 mV
MDC IDC MSMT BATTERY IMPEDANCE: 5310 Ohm
MDC IDC MSMT LEADCHNL RV IMPEDANCE VALUE: 686 Ohm
MDC IDC SET LEADCHNL RV PACING PULSEWIDTH: 1 ms
MDC IDC STAT BRADY RV PERCENT PACED: 0 %

## 2013-11-15 NOTE — Telephone Encounter (Signed)
LMOVM reminding pt to send remote transmission.   

## 2013-11-15 NOTE — Progress Notes (Signed)
Remote pacemaker transmission.   

## 2013-11-16 ENCOUNTER — Telehealth: Payer: Self-pay | Admitting: *Deleted

## 2013-11-24 ENCOUNTER — Encounter: Payer: Self-pay | Admitting: Cardiovascular Disease

## 2013-11-24 ENCOUNTER — Ambulatory Visit (INDEPENDENT_AMBULATORY_CARE_PROVIDER_SITE_OTHER): Payer: 59 | Admitting: Cardiovascular Disease

## 2013-11-24 VITALS — BP 130/84 | HR 63 | Ht 73.0 in | Wt 207.5 lb

## 2013-11-24 DIAGNOSIS — Z79899 Other long term (current) drug therapy: Secondary | ICD-10-CM

## 2013-11-24 DIAGNOSIS — I495 Sick sinus syndrome: Secondary | ICD-10-CM

## 2013-11-24 DIAGNOSIS — R5383 Other fatigue: Secondary | ICD-10-CM

## 2013-11-24 DIAGNOSIS — R55 Syncope and collapse: Secondary | ICD-10-CM

## 2013-11-24 DIAGNOSIS — D689 Coagulation defect, unspecified: Secondary | ICD-10-CM

## 2013-11-24 DIAGNOSIS — Z45018 Encounter for adjustment and management of other part of cardiac pacemaker: Secondary | ICD-10-CM

## 2013-11-24 DIAGNOSIS — R5381 Other malaise: Secondary | ICD-10-CM

## 2013-11-24 DIAGNOSIS — I10 Essential (primary) hypertension: Secondary | ICD-10-CM

## 2013-11-24 DIAGNOSIS — Z45010 Encounter for checking and testing of cardiac pacemaker pulse generator [battery]: Secondary | ICD-10-CM

## 2013-11-24 LAB — PACEMAKER DEVICE OBSERVATION

## 2013-11-24 NOTE — Patient Instructions (Signed)
Your pacemaker battery needs to be replaced.  This will be scheduled at [facility] as an outpatient Tuesday September 15 or Tuesday December 06, 2013.  Your physician recommends that you return for lab work in: you need to have blood work done 5-7 days prior to this procedure at [facility] lab.

## 2013-11-25 ENCOUNTER — Encounter: Payer: Self-pay | Admitting: Cardiovascular Disease
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
## 2013-11-28 ENCOUNTER — Other Ambulatory Visit: Payer: Self-pay | Admitting: *Deleted

## 2013-11-28 ENCOUNTER — Encounter: Payer: Self-pay | Admitting: Cardiovascular Disease

## 2013-11-28 DIAGNOSIS — Z45010 Encounter for checking and testing of cardiac pacemaker pulse generator [battery]: Secondary | ICD-10-CM

## 2013-11-30 LAB — COMPREHENSIVE METABOLIC PANEL
ALK PHOS: 71 U/L (ref 39–117)
ALT: 14 U/L (ref 0–53)
AST: 19 U/L (ref 0–37)
Albumin: 3.9 g/dL (ref 3.5–5.2)
BILIRUBIN TOTAL: 1 mg/dL (ref 0.2–1.2)
BUN: 15 mg/dL (ref 6–23)
CO2: 24 mEq/L (ref 19–32)
CREATININE: 1 mg/dL (ref 0.50–1.35)
Calcium: 9.4 mg/dL (ref 8.4–10.5)
Chloride: 103 mEq/L (ref 96–112)
GLUCOSE: 83 mg/dL (ref 70–99)
Potassium: 4.4 mEq/L (ref 3.5–5.3)
SODIUM: 138 meq/L (ref 135–145)
Total Protein: 6.4 g/dL (ref 6.0–8.3)

## 2013-11-30 LAB — CBC
HEMATOCRIT: 39.1 % (ref 39.0–52.0)
Hemoglobin: 13.9 g/dL (ref 13.0–17.0)
MCH: 31.7 pg (ref 26.0–34.0)
MCHC: 35.5 g/dL (ref 30.0–36.0)
MCV: 89.1 fL (ref 78.0–100.0)
Platelets: 149 10*3/uL — ABNORMAL LOW (ref 150–400)
RBC: 4.39 MIL/uL (ref 4.22–5.81)
RDW: 13.8 % (ref 11.5–15.5)
WBC: 4.5 10*3/uL (ref 4.0–10.5)

## 2013-12-01 LAB — PROTIME-INR
INR: 1.01 (ref <1.50)
Prothrombin Time: 13.3 seconds (ref 11.6–15.2)

## 2013-12-01 LAB — APTT: aPTT: 30 seconds (ref 24–37)

## 2013-12-05 MED ORDER — VANCOMYCIN HCL IN DEXTROSE 1-5 GM/200ML-% IV SOLN
1000.0000 mg | INTRAVENOUS | Status: DC
  Filled 2013-12-05: qty 200

## 2013-12-05 MED ORDER — SODIUM CHLORIDE 0.9 % IR SOLN
80.0000 mg | Status: DC
  Filled 2013-12-05: qty 2

## 2013-12-06 ENCOUNTER — Ambulatory Visit (HOSPITAL_COMMUNITY)
Admission: RE | Admit: 2013-12-06 | Discharge: 2013-12-06 | Disposition: A | Discharge status: Home / Self Care | Payer: 59 | Source: Ambulatory Visit | Attending: Cardiovascular Disease | Admitting: Cardiovascular Disease

## 2013-12-06 ENCOUNTER — Encounter (HOSPITAL_COMMUNITY)
Admission: RE | Disposition: A | Discharge status: Home / Self Care | Payer: Self-pay | Source: Ambulatory Visit | Attending: Cardiovascular Disease

## 2013-12-06 ENCOUNTER — Telehealth: Payer: Self-pay | Admitting: Cardiovascular Disease

## 2013-12-06 DIAGNOSIS — K219 Gastro-esophageal reflux disease without esophagitis: Secondary | ICD-10-CM | POA: Insufficient documentation

## 2013-12-06 DIAGNOSIS — Z6826 Body mass index (BMI) 26.0-26.9, adult: Secondary | ICD-10-CM | POA: Insufficient documentation

## 2013-12-06 DIAGNOSIS — I4510 Unspecified right bundle-branch block: Secondary | ICD-10-CM | POA: Diagnosis not present

## 2013-12-06 DIAGNOSIS — G4733 Obstructive sleep apnea (adult) (pediatric): Secondary | ICD-10-CM | POA: Diagnosis not present

## 2013-12-06 DIAGNOSIS — I10 Essential (primary) hypertension: Secondary | ICD-10-CM | POA: Diagnosis not present

## 2013-12-06 DIAGNOSIS — N529 Male erectile dysfunction, unspecified: Secondary | ICD-10-CM | POA: Insufficient documentation

## 2013-12-06 DIAGNOSIS — F411 Generalized anxiety disorder: Secondary | ICD-10-CM | POA: Insufficient documentation

## 2013-12-06 DIAGNOSIS — Z45018 Encounter for adjustment and management of other part of cardiac pacemaker: Secondary | ICD-10-CM | POA: Insufficient documentation

## 2013-12-06 DIAGNOSIS — Z905 Acquired absence of kidney: Secondary | ICD-10-CM | POA: Insufficient documentation

## 2013-12-06 DIAGNOSIS — Z8553 Personal history of malignant neoplasm of renal pelvis: Secondary | ICD-10-CM | POA: Diagnosis not present

## 2013-12-06 DIAGNOSIS — Z45010 Encounter for checking and testing of cardiac pacemaker pulse generator [battery]: Secondary | ICD-10-CM

## 2013-12-06 DIAGNOSIS — R55 Syncope and collapse: Secondary | ICD-10-CM

## 2013-12-06 HISTORY — PX: PERMANENT PACEMAKER GENERATOR CHANGE: SHX6022

## 2013-12-06 LAB — SURGICAL PCR SCREEN
MRSA, PCR: NEGATIVE
Staphylococcus aureus: POSITIVE — AB

## 2013-12-06 SURGERY — PERMANENT PACEMAKER GENERATOR CHANGE
Anesthesia: LOCAL

## 2013-12-06 MED ORDER — SODIUM CHLORIDE 0.9 % IV SOLN: INTRAVENOUS | Status: DC

## 2013-12-06 MED ORDER — FENTANYL CITRATE 0.05 MG/ML IJ SOLN
INTRAMUSCULAR | Status: AC
  Filled 2013-12-06: qty 2

## 2013-12-06 MED ORDER — MUPIROCIN 2 % EX OINT
1.0000 "application " | TOPICAL_OINTMENT | Freq: Once | CUTANEOUS | Status: DC
  Filled 2013-12-06: qty 22

## 2013-12-06 MED ORDER — MUPIROCIN 2 % EX OINT
TOPICAL_OINTMENT | CUTANEOUS | Status: AC
  Filled 2013-12-06: qty 22

## 2013-12-06 MED ORDER — LIDOCAINE HCL (PF) 1 % IJ SOLN
INTRAMUSCULAR | Status: AC
  Filled 2013-12-06: qty 30

## 2013-12-06 MED ORDER — ONDANSETRON HCL 4 MG/2ML IJ SOLN: 4.0000 mg | Freq: Four times a day (QID) | INTRAMUSCULAR | Status: DC | PRN

## 2013-12-06 MED ORDER — ACETAMINOPHEN 325 MG PO TABS
325.0000 mg | ORAL_TABLET | ORAL | Status: DC | PRN
  Filled 2013-12-06: qty 2

## 2013-12-06 MED ORDER — SODIUM CHLORIDE 0.9 % IJ SOLN: 3.0000 mL | INTRAMUSCULAR | Status: DC | PRN

## 2013-12-06 MED ORDER — MIDAZOLAM HCL 5 MG/5ML IJ SOLN
INTRAMUSCULAR | Status: AC
  Filled 2013-12-06: qty 5

## 2013-12-06 NOTE — Telephone Encounter (Signed)
Closed encounter °

## 2013-12-06 NOTE — CV Procedure (Signed)
Procedure report  Procedure performed:  1. Dual chamber pacemaker generator changeout  2. Light sedation  Reason for procedure:  1. Device generator at elective replacement interval  2. Recurrent neurocardiogenic syncope (cardioinhibitory) Procedure performed by:  Sanda Klein, MD  Complications:  None  Estimated blood loss:  <5 mL  Medications administered during procedure:  Vancomycin 1 g intravenously, lidocaine 1% 30 mL locally, fentanyl 100 mcg intravenously, Versed 4 mg intravenously Device details:   New Generator Medtronic Adapta model number [id number], serial number [id number] Right atrial lead (chronic) Medtronic, model number [id number], serial [id number] (implanted 07/18/2003) Right ventricular lead (chronic)  Medtronic, model number [id number], serial number [id number] (implanted 07/18/2003)  Explanted generator Medtronic,  model number Enpulse [id number], serial number  [id number] (implanted 07/18/2003)  Procedure details:  After the risks and benefits of the procedure were discussed the patient provided informed consent. She was brought to the cardiac catheter lab in the fasting state. The patient was prepped and draped in usual sterile fashion. Local anesthesia with 1% lidocaine was administered to to the left infraclavicular area. A 5-6cm horizontal incision was made parallel with and 2-3 cm caudal to the left clavicle, in the area of an old scar. An older scar was seen closer to the left clavicle. Using minimal electrocautery and mostly sharp and blunt dissection the prepectoral pocket was opened carefully to avoid injury to the loops of chronic leads. Extensive dissection was not necessary. The device was explanted. The pocket was carefully inspected for hemostasis and flushed with copious amounts of antibiotic solution.  The leads were disconnected from the old generator and testing of the lead parameters later showed excellent values. The new generator was connected to the chronic  leads, with appropriate pacing noted.   The entire system was then carefully inserted in the pocket with care been taking that the leads and device assumed a comfortable position without pressure on the incision. Great care was taken that the leads be located deep to the generator. The pocket was then closed in layers using 2 layers of 2-0 Vicryl and cutaneous staples after which a sterile dressing was applied.   At the end of the procedure the following lead parameters were encountered:   Right atrial lead sensed P waves 8.2 mV, impedance 523 ohms, threshold 0.5 at 0.5 ms pulse width.  Right ventricular lead sensed R waves  10.7 mV, impedance 641 ohms, threshold 2.1 at 0.5 ms pulse width (similar to chronic testing)  Sanda Klein, MD, [facility] [phone] office [phone] pager

## 2013-12-06 NOTE — Interval H&P Note (Signed)
History and Physical Interval Note:  12/06/2013 2:10 PM  Brent Stuart  has presented today for surgery, with the diagnosis of battery depletion  The various methods of treatment have been discussed with the patient and family. After consideration of risks, benefits and other options for treatment, the patient has consented to  Procedure(s): PERMANENT PACEMAKER GENERATOR CHANGE (N/A) as a surgical intervention .  The patient's history has been reviewed, patient examined, no change in status, stable for surgery.  I have reviewed the patient's chart and labs.  Questions were answered to the patient's satisfaction.     Jen Eppinger
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
## 2013-12-06 NOTE — Discharge Instructions (Signed)
Pacemaker Battery Change, Care After Refer to this sheet in the next few weeks. These instructions provide you with information on caring for yourself after your procedure. Your health care provider may also give you more specific instructions. Your treatment has been planned according to current medical practices, but problems sometimes occur. Call your health care provider if you have any problems or questions after your procedure. WHAT TO EXPECT AFTER THE PROCEDURE After your procedure, it is typical to have the following sensations:  Soreness at the pacemaker site. HOME CARE INSTRUCTIONS   Keep the incision clean and dry.  Unless advised otherwise, you may shower beginning 48 hours after your procedure.  For the first week after the replacement, avoid stretching motions that pull at the incision site, and avoid heavy exercise with the arm that is on the same side as the incision.  Take medicines only as directed by your health care provider.  Keep all follow-up visits as directed by your health care provider. SEEK MEDICAL CARE IF:   Mupirocin nasal ointment  Use the ointment in both nares twice daily for 5 days. What is this medicine? MUPIROCIN CALCIUM (myoo PEER oh sin KAL see um) is an antibiotic. It is used inside the nose to treat infections that are caused by certain bacteria. This helps prevent the spread of infection to patients and health care workers during outbreaks at institutions. This medicine may be used for other purposes; ask your health care provider or pharmacist if you have questions. COMMON BRAND NAME(S): Bactroban What should I tell my health care provider before I take this medicine? They need to know if you have any of these conditions: -an unusual or allergic reaction to mupirocin, other medicines, foods, dyes, or preservatives -pregnant or trying to get pregnant -breast-feeding How should I use this medicine? This medicine is only for use inside the nose.  Follow the directions on the prescription label. Wash your hands before and after use. Squeeze half the contents of a single-use tube into one nostril, then squeeze the other half into the other nostril. Press the sides of your nose together and gently massage after application to spread the ointment throughout the nostrils. Do not use your medicine more often than directed. Finish the full course of medicine prescribed by your doctor or health care professional even if you think your condition is better. Talk to your pediatrician regarding the use of this medicine in children. Special care may be needed. Overdosage: If you think you have taken too much of this medicine contact a poison control center or emergency room at once. NOTE: This medicine is only for you. Do not share this medicine with others. What if I miss a dose? If you miss a dose, take it as soon as you can. If it is almost time for your next dose, take only that dose. Do not take double or extra doses. What may interact with this medicine? Interactions are not expected. Do not use any other nose products without telling your doctor or health care professional. This list may not describe all possible interactions. Give your health care provider a list of all the medicines, herbs, non-prescription drugs, or dietary supplements you use. Also tell them if you smoke, drink alcohol, or use illegal drugs. Some items may interact with your medicine. What should I watch for while using this medicine? If your nose is severely irritated, burning or stinging from use of this medicine, stop using it and contact your doctor or health  care professional. Do not get this medicine in your eyes. If you do, rinse out with plenty of cool tap water. What side effects may I notice from receiving this medicine? Side effects that you should report to your doctor or health care professional as soon as possible: -severe irritation, burning, stinging, or  pain Side effects that usually do not require medical attention (report to your doctor or health care professional if they continue or are bothersome): -altered taste -cough -headache -skin itching -sore throat -stuffy or runny nose This list may not describe all possible side effects. Call your doctor for medical advice about side effects. You may report side effects to FDA at 1-800-FDA-1088. Where should I keep my medicine? Keep out of the reach of children. Store at room temperature between 15 and 30 degrees C (59 and 86 degrees F). Do not refrigerate. One tube of ointment is for single use in both nostrils. Throw away after use. NOTE: This sheet is a summary. It may not cover all possible information. If you have questions about this medicine, talk to your doctor, pharmacist, or health care provider.  2015, Elsevier/Gold Standard. (2007-09-20 14:36:10)   You have pain at the incision site that is not relieved by over-the-counter or prescription medicine.  There is drainage or pus from the incision site.  There is swelling larger than a lime at the incision site.  You develop red streaking that extends above or below the incision site.  You feel brief, intermittent palpitations, light-headedness, or any symptoms that you feel might be related to your heart. SEEK IMMEDIATE MEDICAL CARE IF:   You experience chest pain that is different than the pain at the pacemaker site.  You experience shortness of breath.  You have palpitations or irregular heartbeat.  You have light-headedness that does not go away quickly.  You faint.  You have pain that gets worse and is not relieved by medicine. Document Released: 12/22/2012 Document Revised: 07/18/2013 Document Reviewed: 12/22/2012 [facility] Patient Information 2015 [place], [place]. This information is not intended to replace advice given to you by your health care provider. Make sure you discuss any questions you have with your health  care provider.   Supplemental Discharge Instructions for  Pacemaker/Defibrillator Patients   WOUND CARE   Keep the wound area clean and dry.  Remove the dressing the day after you return home (usually 48 hours after the procedure).   DO NOT SUBMERGE UNDER WATER UNTIL FULLY HEALED (no tub baths, hot tubs, swimming pools, etc.).    You  may shower or take a sponge bath after the dressing is removed. DO NOT SOAK the area and do not allow the shower to directly spray on the site.   If you have staples, these will be removed in the office in 7-14 days.   If you have tape/steri-strips on your wound, these will fall off; do not pull them off prematurely.     No bandage is needed on the site.  DO  NOT apply any creams, oils, or ointments to the wound area.   If you notice any drainage or discharge from the wound, any swelling, excessive redness or bruising at the site, or if you develop a fever > 101? F after you are discharged home, call the office at once.  Special Instructions   You are still able to use cellular telephones.  Avoid carrying your cellular phone near your device.   When traveling through airports, show security personnel your identification card to  avoid being screened in the metal detectors.    Avoid arc welding equipment, MRI testing (magnetic resonance imaging), TENS units (transcutaneous nerve stimulators).  Call the office for questions about other devices.   Avoid electrical appliances that are in poor condition or are not properly grounded.   Microwave ovens are safe to be near or to operate.

## 2013-12-07 ENCOUNTER — Telehealth: Payer: Self-pay | Admitting: Cardiology

## 2013-12-07 ENCOUNTER — Telehealth: Payer: Self-pay | Admitting: Internal Medicine

## 2013-12-07 NOTE — Telephone Encounter (Signed)
Pt wanted to know how his new home monitor worked. I informed him that he needs to send the 1st transmission and then the monitor will do its nightly audits and it will send to [place] every 3 months. He verbalized understanding.

## 2013-12-07 NOTE — Telephone Encounter (Signed)
New Message  Pt called requests a call back to discuss if his signal was received. Please call

## 2013-12-07 NOTE — Telephone Encounter (Signed)
Spoke w/pt and answered all questions about monitor. Transmission was received.

## 2013-12-09 ENCOUNTER — Encounter: Payer: Self-pay | Admitting: Cardiovascular Disease

## 2013-12-12 ENCOUNTER — Telehealth: Payer: Self-pay | Admitting: Cardiology

## 2013-12-12 ENCOUNTER — Telehealth: Payer: Self-pay | Admitting: Cardiovascular Disease

## 2013-12-12 NOTE — Telephone Encounter (Signed)
Pt called and wanted to clarify if his pacemaker was automatic or manual transmission. I informed him that it was manual. Pt stated that he received a return monitor kit and he did not know why. I told him to disregard return kit.

## 2013-12-12 NOTE — Telephone Encounter (Signed)
Pt said he just finish taking his last antibiotic for mercer on Saturday. Does he need to go back to the doctor or lab to check to see if he still has it?

## 2013-12-12 NOTE — Telephone Encounter (Signed)
Spoke with pt, aware there is nothing else that needs to be done.

## 2013-12-14 ENCOUNTER — Telehealth: Payer: Self-pay | Admitting: *Deleted

## 2013-12-14 NOTE — Telephone Encounter (Signed)
Immunization records recorded.

## 2013-12-16 ENCOUNTER — Encounter: Payer: Self-pay | Admitting: Internal Medicine

## 2013-12-20 ENCOUNTER — Encounter: Payer: Self-pay | Admitting: Cardiovascular Disease

## 2013-12-20 ENCOUNTER — Encounter: Payer: Self-pay | Admitting: *Deleted

## 2013-12-22 ENCOUNTER — Encounter: Payer: Self-pay | Admitting: Physician Assistant

## 2013-12-22 ENCOUNTER — Ambulatory Visit (INDEPENDENT_AMBULATORY_CARE_PROVIDER_SITE_OTHER): Payer: 59 | Admitting: Physician Assistant

## 2013-12-22 VITALS — BP 148/96 | HR 64 | Ht 73.0 in | Wt 206.5 lb

## 2013-12-22 DIAGNOSIS — Z45010 Encounter for checking and testing of cardiac pacemaker pulse generator [battery]: Secondary | ICD-10-CM

## 2013-12-22 DIAGNOSIS — I10 Essential (primary) hypertension: Secondary | ICD-10-CM

## 2013-12-22 NOTE — Progress Notes (Signed)
Date:  12/22/2013   ID:  Brent Stuart, DOB [birth year], MRN [id number]  PCP:  Irven Shelling, MD  Primary Cardiologist:  Croitoru     History of Present Illness: Brent Stuart is a 63 y.o. male who has a dual-chamber permanent pacemaker that reached elective replacement interval in late June. It is otherwise functioning normally.   Many years ago he had repeated episodes of unexplained syncope, some leading to serious accidents. Finally he was diagnosed as having neurocardiogenic syncope with prolonged episodes of sinus node arrest (up to 15 seconds in duration). This was eventually diagnosed with an implantable loop recorder and he had a pacemaker implanted in 2005.   He has mild and compensated systemic hypertension and a history of erectile dysfunction. He has no evidence of structural heart disease by echocardiography (EF 55-60% July 2014, minimal him LDH, no valvular abnormalities) or by nuclear stress testing (normal study in September 2011).   He has had previous surgery for ruptured appendix and partial left nephrectomy for an incidentally discovered renal cell carcinoma. Used to work as a Airline pilot. He has had a previous lumbar laminectomy in 2008. His to be severely obese but has lost substantial weight 2 healthy lifestyle changes. He has obstructive sleep apnea and is compliant with CPAP.   Patient underwent pacemaker generator change on 12/06/2013. The new device is a Medtronic Adapta model number [id number], serial number [id number].  Right atrial lead (chronic) Medtronic, model number [id number], serial [id number] (implanted 07/18/2003).  Right ventricular lead (chronic) Medtronic, model number [id number], serial number [id number] (implanted 07/18/2003) Patient presents for a wound site check.  He has no complaints. He brought a detailed history of his blood pressures.  The patient currently denies nausea, vomiting, fever, chest pain, shortness of breath, orthopnea, dizziness,  PND, cough, congestion, abdominal pain, hematochezia, melena, lower extremity edema, claudication.  Wt Readings from Last 3 Encounters:  12/22/13 206 lb 8 oz (93.668 kg)  12/06/13 204 lb (92.534 kg)  12/06/13 204 lb (92.534 kg)     Past Medical History  Diagnosis Date  . Orthostatic lightheadedness     Occasional. Only one episode in 08/2012 when he was not in [employer] of presyncope that was transient.  . Hypertension     mild  . Impotence     mild  . Sick sinus syndrome     Significant bradycardia with prolonged asystole ans syncope, prompting his pacemaker implant in 2005.  Marland Kitchen Neurocardiogenic syncope     Hx of chronic neurocardiogenic syncope and he has had a negative tilt-table in the past, but significant bradycardia with prolonged asystole ans syncope, prompting his pacemaker implant in 2005.  [name] (acromioclavicular) joint bone spurs     On feet  . Left knee pain   . Truncal obesity   . Exogenous obesity     mild  . Plantar fasciitis   . Complication of anesthesia     SHAKING AFTER WAKING UP FROM SURGERY   . Pacemaker   . RBBB   . OSA on CPAP     SETTINGS AT 8.5   . Anxiety   . GERD (gastroesophageal reflux disease)     Current Outpatient Prescriptions  Medication Sig Dispense Refill  . acetaminophen (TYLENOL) 500 MG tablet Take 1,000 mg by mouth every 6 (six) hours as needed (pain).      Marland Kitchen ALPRAZolam (XANAX) 0.25 MG tablet Take 0.125 mg by mouth 2 (two) times daily as needed for anxiety.       Marland Kitchen  hydroxypropyl methylcellulose / hypromellose (ISOPTO TEARS / GONIOVISC) 2.5 % ophthalmic solution Place 1 drop into both eyes daily as needed for dry eyes.      Marland Kitchen ibuprofen (ADVIL,MOTRIN) 200 MG tablet Take 400-600 mg by mouth every 6 (six) hours as needed (pain).       . methocarbamol (ROBAXIN) 500 MG tablet Take 500 mg by mouth 2 (two) times daily as needed for muscle spasms.       . metoprolol tartrate (LOPRESSOR) 25 MG tablet Take 1 tablet (25 mg total) by mouth 2 (two)  times daily.  60 tablet  10   No current facility-administered medications for this visit.    Allergies:    Allergies  Allergen Reactions  . Penicillins Hives  . Septra [Sulfamethoxazole-Trimethoprim]     Unknown  . Zoloft [Sertraline Hcl]     Unknown    Social History:  The patient  reports that he has never smoked. He has never used smokeless tobacco. He reports that he does not drink alcohol or use illicit drugs.   Family history:   Family History  Problem Relation Age of Onset  . Hypertension      ROS:  Please see the history of present illness.  All other systems reviewed and negative.   PHYSICAL EXAM: VS:  BP 148/96  Pulse 64  Ht 6\' 1"  (1.854 m)  Wt 206 lb 8 oz (93.668 kg)  BMI 27.25 kg/m2 Well nourished, well developed, in no acute distress HEENT: Pupils are equal round react to light accommodation extraocular movements are intact.  Neck: no JVDNo cervical lymphadenopathy. Cardiac: Regular rate and rhythm without murmurs rubs or gallops. Lungs:  clear to auscultation bilaterally, no wheezing, rhonchi or rales Ext: no lower extremity edema.  2+ radial and dorsalis pedis pulses. Skin: warm and dry Neuro:  Grossly normal    ASSESSMENT AND PLAN:  Problem List Items Addressed This Visit   Essential hypertension - Primary     Blood pressure is mildly elevated today however, the patient is quite a detailed record of his blood pressures in the high aspect seen was about 132/91 most of them are in the 110 range over 70-80. He is only on Toprol 25 mg twice daily.    Pacemaker at end of battery life     Status post generator change on 12/06/2013. The wound is healing nicely there is no signs of infection no erythema, drainage, ecchymosis. Staples were removed.

## 2013-12-22 NOTE — Assessment & Plan Note (Signed)
Blood pressure is mildly elevated today however, the patient is quite a detailed record of his blood pressures in the high aspect seen was about 132/91 most of them are in the 110 range over 70-80. He is only on Toprol 25 mg twice daily.

## 2013-12-22 NOTE — Assessment & Plan Note (Signed)
Status post generator change on 12/06/2013. The wound is healing nicely there is no signs of infection no erythema, drainage, ecchymosis. Staples were removed.

## 2013-12-22 NOTE — Patient Instructions (Signed)
1.   Follow up with Dr. Loletha Grayer in 1 month for device check.

## 2014-01-17 ENCOUNTER — Encounter: Payer: Self-pay | Admitting: Cardiovascular Disease

## 2014-01-17 ENCOUNTER — Ambulatory Visit (INDEPENDENT_AMBULATORY_CARE_PROVIDER_SITE_OTHER): Payer: 59 | Admitting: Cardiovascular Disease

## 2014-01-17 VITALS — BP 130/88 | HR 66 | Ht 73.0 in | Wt 207.0 lb

## 2014-01-17 DIAGNOSIS — I10 Essential (primary) hypertension: Secondary | ICD-10-CM

## 2014-01-17 DIAGNOSIS — Z950 Presence of cardiac pacemaker: Secondary | ICD-10-CM

## 2014-01-17 DIAGNOSIS — R55 Syncope and collapse: Secondary | ICD-10-CM

## 2014-01-17 DIAGNOSIS — I495 Sick sinus syndrome: Secondary | ICD-10-CM

## 2014-01-17 LAB — MDC_IDC_ENUM_SESS_TYPE_INCLINIC
Battery Remaining Longevity: 173 mo
Brady Statistic AS VS Percent: 99 %
Lead Channel Impedance Value: 621 Ohm
Lead Channel Impedance Value: 770 Ohm
Lead Channel Pacing Threshold Amplitude: 1.25 V
Lead Channel Pacing Threshold Pulse Width: 0.64 ms
Lead Channel Sensing Intrinsic Amplitude: 11.2 mV
Lead Channel Sensing Intrinsic Amplitude: 4 mV
Lead Channel Setting Pacing Amplitude: 1.5 V
Lead Channel Setting Pacing Pulse Width: 0.64 ms
Lead Channel Setting Sensing Sensitivity: 5.6 mV
MDC IDC MSMT BATTERY IMPEDANCE: 100 Ohm
MDC IDC MSMT BATTERY VOLTAGE: 2.8 V
MDC IDC MSMT LEADCHNL RA PACING THRESHOLD AMPLITUDE: 0.5 V
MDC IDC MSMT LEADCHNL RA PACING THRESHOLD PULSEWIDTH: 0.4 ms
MDC IDC SESS DTM: 20151103162853
MDC IDC SET LEADCHNL RV PACING AMPLITUDE: 2.5 V
MDC IDC STAT BRADY AP VP PERCENT: 0 %
MDC IDC STAT BRADY AP VS PERCENT: 1 %
MDC IDC STAT BRADY AS VP PERCENT: 0 %

## 2014-01-17 NOTE — Progress Notes (Signed)
Patient ID: Brent Stuart, male   DOB: 08-13-1950, 63 y.o.   MRN: [id number]     Reason for office visit Neurocardiogenic syncope, pacemaker check  [name] returns after his recent pacemaker generator change. The site has healed very nicely. He has not had any episodes of near syncope or syncope. His maker interrogation shows normal device function. Several rate drop events have occurred, but he has been unaware of them. He brought a detailed record of his blood pressure for the last several days. His systolic blood pressures consistently in the low 130s. His diastolic blood pressure is usually in the high 70s/low 80s, with just 2 days when his diastolic blood pressure was 91 and 93 mmHg respectively. He has lost a little more weight but still has a BMI of around 27. He is wearing 38 inch pants but can "squeeze into" 36 inch pants.   Allergies  Allergen Reactions  . Penicillins Hives  . Septra [Sulfamethoxazole-Trimethoprim]     Unknown  . Zoloft [Sertraline Hcl]     Unknown    Current Outpatient Prescriptions  Medication Sig Dispense Refill  . acetaminophen (TYLENOL) 500 MG tablet Take 1,000 mg by mouth every 6 (six) hours as needed (pain).    Marland Kitchen ALPRAZolam (XANAX) 0.25 MG tablet Take 0.125 mg by mouth 2 (two) times daily as needed for anxiety.     . hydroxypropyl methylcellulose / hypromellose (ISOPTO TEARS / GONIOVISC) 2.5 % ophthalmic solution Place 1 drop into both eyes daily as needed for dry eyes.    Marland Kitchen ibuprofen (ADVIL,MOTRIN) 200 MG tablet Take 400-600 mg by mouth every 6 (six) hours as needed (pain).     . methocarbamol (ROBAXIN) 500 MG tablet Take 500 mg by mouth 2 (two) times daily as needed for muscle spasms.     . metoprolol tartrate (LOPRESSOR) 25 MG tablet Take 1 tablet (25 mg total) by mouth 2 (two) times daily. 60 tablet 10   No current facility-administered medications for this visit.    Past Medical History  Diagnosis Date  . Orthostatic lightheadedness    Occasional. Only one episode in 08/2012 when he was not in [employer] of presyncope that was transient.  . Hypertension     mild  . Impotence     mild  . Sick sinus syndrome     Significant bradycardia with prolonged asystole ans syncope, prompting his pacemaker implant in 2005.  Marland Kitchen Neurocardiogenic syncope     Hx of chronic neurocardiogenic syncope and he has had a negative tilt-table in the past, but significant bradycardia with prolonged asystole ans syncope, prompting his pacemaker implant in 2005.  [name] (acromioclavicular) joint bone spurs     On feet  . Left knee pain   . Truncal obesity   . Exogenous obesity     mild  . Plantar fasciitis   . Complication of anesthesia     SHAKING AFTER WAKING UP FROM SURGERY   . Pacemaker   . RBBB   . OSA on CPAP     SETTINGS AT 8.5   . Anxiety   . GERD (gastroesophageal reflux disease)     Past Surgical History  Procedure Laterality Date  . Pacemaker insertion  07/18/03    Medtronic Enpulse [id number] implanted by Dr Rollene Fare  . Colon surgery  1994    By Dr. Leafy Kindle for nonmalignant disease with associated appendectomy  . Appendectomy  1994  . Decompressive lumbar laminectomy level 3    . Decompressive lumbar laminectomy level 4    .  Microdiscectomy lumbar    . Back surgery  10/21/2006    Foraminotomy  . Bunionectomy Right     First MTP area  . Septoplasty    . Robotic assited partial nephrectomy Left 10/19/2013    Procedure: ROBOTIC ASSISTED LAPAROSCOPIC LEFT PARTIAL NEPHRECTOMY, EXSTENSIVE LAPAROSCOPIC LYSIS OF ADHESIONS;  Surgeon: Ardis Hughs, MD;  Location: [facility];  Service: Urology;  Laterality: Left;    Family History  Problem Relation Age of Onset  . Hypertension      History   Social History  . Marital Status: Married    Spouse Name: N/A    Number of Children: N/A  . Years of Education: N/A   Occupational History  . Not on file.   Social History Main Topics  . Smoking status: Never Smoker   . Smokeless tobacco:  Never Used  . Alcohol Use: No  . Drug Use: No  . Sexual Activity: Not on file   Other Topics Concern  . Not on file   Social History Narrative   Previously a IT trainer    Review of systems: The patient specifically denies any chest pain at rest or with exertion, dyspnea at rest or with exertion, orthopnea, paroxysmal nocturnal dyspnea, syncope, palpitations, focal neurological deficits, intermittent claudication, lower extremity edema, unexplained weight gain, cough, hemoptysis or wheezing.  The patient also denies abdominal pain, nausea, vomiting, dysphagia, diarrhea, constipation, polyuria, polydipsia, dysuria, hematuria, frequency, urgency, abnormal bleeding or bruising, fever, chills, unexpected weight changes, mood swings, change in skin or hair texture, change in voice quality, auditory or visual problems, allergic reactions or rashes, new musculoskeletal complaints other than usual "aches and pains".   PHYSICAL EXAM BP 130/88 mmHg  Pulse 66  Ht 6\' 1"  (1.854 m)  Wt 207 lb (93.895 kg)  BMI 27.32 kg/m2  General: Alert, oriented x3, no distress Head: no evidence of trauma, PERRL, EOMI, no exophtalmos or lid lag, no myxedema, no xanthelasma; normal ears, nose and oropharynx Neck: normal jugular venous pulsations and no hepatojugular reflux; brisk carotid pulses without delay and no carotid bruits Chest: clear to auscultation, no signs of consolidation by percussion or palpation, normal fremitus, symmetrical and full respiratory excursions. Well-healed pacemaker scar Cardiovascular: normal position and quality of the apical impulse, regular rhythm, normal first and second heart sounds, no murmurs, rubs or gallops Abdomen: no tenderness or distention, no masses by palpation, no abnormal pulsatility or arterial bruits, normal bowel sounds, no hepatosplenomegaly Extremities: no clubbing, cyanosis or edema; 2+ radial, ulnar and brachial pulses bilaterally; 2+ right femoral, posterior  tibial and dorsalis pedis pulses; 2+ left femoral, posterior tibial and dorsalis pedis pulses; no subclavian or femoral bruits Neurological: grossly nonfocal    EKG: NSR  Lipid Panel  No results found for: CHOL, TRIG, HDL, CHOLHDL, VLDL, LDLCALC, LDLDIRECT  BMET    Component Value Date/Time   NA 138 11/30/2013 1116   K 4.4 11/30/2013 1116   CL 103 11/30/2013 1116   CO2 24 11/30/2013 1116   GLUCOSE 83 11/30/2013 1116   BUN 15 11/30/2013 1116   CREATININE 1.00 11/30/2013 1116   CREATININE 1.33 10/20/2013 0350   CALCIUM 9.4 11/30/2013 1116   GFRNONAA 56* 10/20/2013 0350   GFRAA 65* 10/20/2013 0350     ASSESSMENT AND PLAN  Well-healed pacemaker surgical site and normally functioning dual-chamber permanent pacemaker without any episodes of neurocardiogenic syncope. Hypertension is well compensated. Will follow-up on a yearly basis, with remote pacemaker downloads every 3 months. Encouraged continued efforts at  gradual weight loss, first target is to get weight less than 200 pounds.  Holli Humbles, MD, [place] [phone] office [phone] pager

## 2014-01-17 NOTE — Patient Instructions (Addendum)
Remote monitoring is used to monitor your pacemaker  from home. This monitoring reduces the number of office visits required to check your device to one time per year. It allows [place] to keep an eye on the functioning of your device to ensure it is working properly. You are scheduled for a device check from home on 04/20/2014. You may send your transmission at any time that day. If you have a wireless device, the transmission will be sent automatically. After your physician reviews your transmission, you will receive a postcard with your next transmission date.  Your physician recommends that you schedule a follow-up appointment in: 12 months with Dr.Croitoru with pacemaker check.

## 2014-01-23 NOTE — Telephone Encounter (Signed)
Spoke to pt since this telephone note.

## 2014-01-25 ENCOUNTER — Encounter: Payer: Self-pay | Admitting: Cardiovascular Disease

## 2014-02-23 ENCOUNTER — Encounter (HOSPITAL_COMMUNITY): Payer: Self-pay | Admitting: Cardiovascular Disease

## 2014-04-20 ENCOUNTER — Ambulatory Visit (INDEPENDENT_AMBULATORY_CARE_PROVIDER_SITE_OTHER): Payer: 59 | Admitting: *Deleted

## 2014-04-20 DIAGNOSIS — I495 Sick sinus syndrome: Secondary | ICD-10-CM

## 2014-04-20 NOTE — Progress Notes (Signed)
Remote pacemaker transmission.   

## 2014-04-28 LAB — MDC_IDC_ENUM_SESS_TYPE_REMOTE
Battery Impedance: 100 Ohm
Battery Remaining Longevity: 155 mo
Brady Statistic AP VS Percent: 1 %
Brady Statistic AS VP Percent: 0 %
Brady Statistic AS VS Percent: 99 %
Date Time Interrogation Session: 20160204133614
Lead Channel Impedance Value: 529 Ohm
Lead Channel Impedance Value: 675 Ohm
Lead Channel Pacing Threshold Amplitude: 0.5 V
Lead Channel Pacing Threshold Pulse Width: 0.4 ms
Lead Channel Sensing Intrinsic Amplitude: 2.8 mV
Lead Channel Sensing Intrinsic Amplitude: 8 mV
Lead Channel Setting Pacing Amplitude: 1.5 V
Lead Channel Setting Pacing Amplitude: 5 V
Lead Channel Setting Pacing Pulse Width: 1 ms
MDC IDC MSMT BATTERY VOLTAGE: 2.79 V
MDC IDC SET LEADCHNL RV SENSING SENSITIVITY: 4 mV
MDC IDC STAT BRADY AP VP PERCENT: 0 %

## 2014-05-01 ENCOUNTER — Ambulatory Visit (HOSPITAL_COMMUNITY)
Admission: RE | Admit: 2014-05-01 | Discharge: 2014-05-01 | Disposition: A | Discharge status: Home / Self Care | Payer: 59 | Source: Ambulatory Visit | Attending: Urology | Admitting: Urology

## 2014-05-01 ENCOUNTER — Telehealth: Payer: Self-pay | Admitting: Cardiovascular Disease

## 2014-05-01 ENCOUNTER — Encounter: Payer: Self-pay | Admitting: Cardiology

## 2014-05-01 ENCOUNTER — Other Ambulatory Visit (HOSPITAL_COMMUNITY): Payer: Self-pay | Admitting: Urology

## 2014-05-01 DIAGNOSIS — C642 Malignant neoplasm of left kidney, except renal pelvis: Secondary | ICD-10-CM | POA: Diagnosis not present

## 2014-05-01 MED ORDER — METOPROLOL TARTRATE 25 MG PO TABS: 25.0000 mg | ORAL_TABLET | Freq: Two times a day (BID) | ORAL | Status: DC

## 2014-05-01 NOTE — Telephone Encounter (Signed)
°  1. Which medications need to be refilled?Metoprolol2. Which pharmacy is medication to be sent to?[phone] 3. Do they need a 30 day or 90 day supply? 90 and refills  4. Would they like a call back once the medication has been sent to the pharmacy? no

## 2014-05-01 NOTE — Telephone Encounter (Signed)
Rx refill sent to patient pharmacy  LM for patient

## 2014-05-08 ENCOUNTER — Encounter: Payer: Self-pay | Admitting: Cardiovascular Disease

## 2014-07-24 ENCOUNTER — Ambulatory Visit (INDEPENDENT_AMBULATORY_CARE_PROVIDER_SITE_OTHER): Payer: 59 | Admitting: *Deleted

## 2014-07-24 ENCOUNTER — Encounter: Payer: Self-pay | Admitting: Cardiovascular Disease

## 2014-07-24 DIAGNOSIS — I495 Sick sinus syndrome: Secondary | ICD-10-CM

## 2014-07-24 NOTE — Progress Notes (Signed)
Remote pacemaker transmission.   

## 2014-07-26 LAB — CUP PACEART REMOTE DEVICE CHECK
Battery Impedance: 100 Ohm
Brady Statistic AP VP Percent: 0 %
Brady Statistic AP VS Percent: 1 %
Brady Statistic AS VS Percent: 99 %
Lead Channel Impedance Value: 515 Ohm
Lead Channel Pacing Threshold Amplitude: 0.375 V
Lead Channel Pacing Threshold Pulse Width: 0.4 ms
Lead Channel Sensing Intrinsic Amplitude: 2.8 mV
Lead Channel Sensing Intrinsic Amplitude: 22.4 mV
Lead Channel Setting Pacing Amplitude: 5 V
Lead Channel Setting Sensing Sensitivity: 4 mV
MDC IDC MSMT BATTERY REMAINING LONGEVITY: 154 mo
MDC IDC MSMT BATTERY VOLTAGE: 2.79 V
MDC IDC MSMT LEADCHNL RV IMPEDANCE VALUE: 683 Ohm
MDC IDC SESS DTM: 20160509093756
MDC IDC SET LEADCHNL RA PACING AMPLITUDE: 1.5 V
MDC IDC SET LEADCHNL RV PACING PULSEWIDTH: 1 ms
MDC IDC STAT BRADY AS VP PERCENT: 0 %

## 2014-07-27 ENCOUNTER — Encounter: Payer: Self-pay | Admitting: Cardiology

## 2014-10-25 ENCOUNTER — Ambulatory Visit (INDEPENDENT_AMBULATORY_CARE_PROVIDER_SITE_OTHER): Payer: Commercial Managed Care - HMO | Admitting: *Deleted

## 2014-10-25 DIAGNOSIS — I495 Sick sinus syndrome: Secondary | ICD-10-CM | POA: Diagnosis not present

## 2014-10-25 NOTE — Progress Notes (Signed)
Remote pacemaker transmission.   

## 2014-11-01 LAB — CUP PACEART REMOTE DEVICE CHECK
Battery Remaining Longevity: 155 mo
Battery Voltage: 2.79 V
Brady Statistic AP VP Percent: 0 %
Brady Statistic AP VS Percent: 1 %
Brady Statistic AS VP Percent: 0 %
Brady Statistic AS VS Percent: 99 %
Date Time Interrogation Session: 20160810092540
Lead Channel Impedance Value: 710 Ohm
Lead Channel Pacing Threshold Amplitude: 0.5 V
Lead Channel Pacing Threshold Amplitude: HIGH
Lead Channel Sensing Intrinsic Amplitude: 2.8 mV
Lead Channel Sensing Intrinsic Amplitude: 8 mV
Lead Channel Setting Pacing Amplitude: 5 V
Lead Channel Setting Sensing Sensitivity: 4 mV
MDC IDC MSMT BATTERY IMPEDANCE: 100 Ohm
MDC IDC MSMT LEADCHNL RA IMPEDANCE VALUE: 506 Ohm
MDC IDC MSMT LEADCHNL RA PACING THRESHOLD PULSEWIDTH: 0.4 ms
MDC IDC SET LEADCHNL RA PACING AMPLITUDE: 1.5 V
MDC IDC SET LEADCHNL RV PACING PULSEWIDTH: 1 ms

## 2014-11-15 ENCOUNTER — Encounter: Payer: Self-pay | Admitting: Cardiology

## 2014-11-21 ENCOUNTER — Encounter: Payer: Self-pay | Admitting: Cardiovascular Disease

## 2014-11-22 DIAGNOSIS — B351 Tinea unguium: Secondary | ICD-10-CM

## 2015-02-22 ENCOUNTER — Encounter: Payer: Self-pay | Admitting: *Deleted

## 2015-02-28 ENCOUNTER — Telehealth: Payer: Self-pay | Admitting: Cardiovascular Disease

## 2015-02-28 NOTE — Telephone Encounter (Signed)
NEw Message  Pt calling to speak w/ Device- pt wanted to know if he should send in remote check and have results before making an appoint w/ Croitoru. Please call back and discuss.

## 2015-03-01 ENCOUNTER — Ambulatory Visit (INDEPENDENT_AMBULATORY_CARE_PROVIDER_SITE_OTHER): Payer: Commercial Managed Care - HMO | Admitting: *Deleted

## 2015-03-01 DIAGNOSIS — I495 Sick sinus syndrome: Secondary | ICD-10-CM | POA: Diagnosis not present

## 2015-03-01 NOTE — Progress Notes (Signed)
Remote pacemaker transmission.   

## 2015-03-01 NOTE — Telephone Encounter (Signed)
Spoke w/ pt and informed him that he can do a remote transmission today and see MD in March. Pt verbalized understanding and agreed to this plan.

## 2015-03-05 LAB — CUP PACEART REMOTE DEVICE CHECK
Battery Impedance: 100 Ohm
Brady Statistic AP VS Percent: 1 %
Brady Statistic AS VP Percent: 0 %
Brady Statistic AS VS Percent: 99 %
Date Time Interrogation Session: 20161215171956
Implantable Lead Implant Date: 20050503
Implantable Lead Location: 753859
Implantable Lead Location: 753860
Implantable Lead Model: 5092
Implantable Lead Model: 5594
Lead Channel Impedance Value: 680 Ohm
Lead Channel Pacing Threshold Amplitude: 0.5 V
Lead Channel Pacing Threshold Pulse Width: 0.4 ms
Lead Channel Sensing Intrinsic Amplitude: 2.8 mV
Lead Channel Setting Pacing Amplitude: 1.5 V
Lead Channel Setting Pacing Amplitude: 5 V
MDC IDC LEAD IMPLANT DT: 20050503
MDC IDC MSMT BATTERY REMAINING LONGEVITY: 155 mo
MDC IDC MSMT BATTERY VOLTAGE: 2.79 V
MDC IDC MSMT LEADCHNL RA IMPEDANCE VALUE: 512 Ohm
MDC IDC MSMT LEADCHNL RV SENSING INTR AMPL: 8 mV
MDC IDC SET LEADCHNL RV PACING PULSEWIDTH: 1 ms
MDC IDC SET LEADCHNL RV SENSING SENSITIVITY: 4 mV
MDC IDC STAT BRADY AP VP PERCENT: 0 %

## 2015-03-08 ENCOUNTER — Encounter: Payer: Self-pay | Admitting: Cardiology

## 2015-03-13 ENCOUNTER — Encounter: Payer: Self-pay | Admitting: *Deleted

## 2015-05-02 ENCOUNTER — Other Ambulatory Visit: Payer: Self-pay | Admitting: Cardiovascular Disease

## 2015-05-03 NOTE — Telephone Encounter (Signed)
Rx request sent to pharmacy.  

## 2015-05-15 ENCOUNTER — Ambulatory Visit (HOSPITAL_COMMUNITY)
Admission: RE | Admit: 2015-05-15 | Discharge: 2015-05-15 | Disposition: A | Discharge status: Home / Self Care | Payer: Commercial Managed Care - HMO | Source: Ambulatory Visit | Attending: Urology | Admitting: Urology

## 2015-05-15 ENCOUNTER — Other Ambulatory Visit: Payer: Self-pay | Admitting: Urology

## 2015-05-15 DIAGNOSIS — C642 Malignant neoplasm of left kidney, except renal pelvis: Secondary | ICD-10-CM

## 2015-05-22 ENCOUNTER — Encounter: Payer: Commercial Managed Care - HMO | Admitting: Cardiovascular Disease

## 2015-06-04 ENCOUNTER — Other Ambulatory Visit: Payer: Self-pay | Admitting: Cardiovascular Disease

## 2015-06-04 NOTE — Telephone Encounter (Signed)
Rx request sent to pharmacy.  

## 2015-06-08 ENCOUNTER — Encounter: Payer: Self-pay | Admitting: Cardiovascular Disease

## 2015-06-08 ENCOUNTER — Ambulatory Visit (INDEPENDENT_AMBULATORY_CARE_PROVIDER_SITE_OTHER): Payer: Commercial Managed Care - HMO | Admitting: Cardiovascular Disease

## 2015-06-08 VITALS — BP 144/92 | HR 76 | Ht 74.0 in | Wt 220.1 lb

## 2015-06-08 DIAGNOSIS — I10 Essential (primary) hypertension: Secondary | ICD-10-CM

## 2015-06-08 DIAGNOSIS — R55 Syncope and collapse: Secondary | ICD-10-CM

## 2015-06-08 DIAGNOSIS — G4730 Sleep apnea, unspecified: Secondary | ICD-10-CM | POA: Diagnosis not present

## 2015-06-08 DIAGNOSIS — Z950 Presence of cardiac pacemaker: Secondary | ICD-10-CM | POA: Diagnosis not present

## 2015-06-08 DIAGNOSIS — E663 Overweight: Secondary | ICD-10-CM

## 2015-06-08 NOTE — Progress Notes (Signed)
Patient ID: Brent Stuart, male   DOB: 1950/10/25, 65 y.o.   MRN: [id number]    Cardiology Office Note    Date:  06/09/2015   ID:  Brent Stuart, DOB 12/15/1950, MRN [id number]  PCP:  Irven Shelling, MD  Cardiologist:   Sanda Klein, MD   Chief Complaint  Patient presents with  . Follow-up    no chest pain, no shortness of breath, no edema, no pain or cramping in legs, no lightheadedness or dizziness, no fatigue    History of Present Illness:  Brent Stuart is a 65 y.o. male with a history of pacemaker implanted for neurally mediated syncope and sinus pauses, originally implanted in 2005 with a generator change out in 2015. He also has problems with Overweight, systemic hypertension and obstructive sleep apnea.  He has not had syncope since pacemaker implantation but has occasional episodes of flushing and weakness which sounds like vasodepressor near-syncope. These are mild and easily reversed by laying down. He denies dyspnea, edema, angina, palpitations, focal neurological events and claudication. He does complain of erectile dysfunction.  Pacemaker interrogation shows normal device function. The device is programmed DDD 50 bpm and he only has 1% atrial pacing and 0.1% ventricular pacing (over 250 episodes of sudden bradycardia response). There've been 56 episodes of high ventricular rate, likely sinus tachycardia. No electrograms are available. Heart rate histogram is favorable, there is no evidence of sinus node dysfunction.  He has gained 13 pounds since his appointment last year. He has noticed that his blood pressures trending upwards. His systolic blood pressure is consistently in the normal range, but he has fairly frequent days when his diastolic blood pressure is slightly above 90 mmHg.  Past Medical History  Diagnosis Date  . Orthostatic lightheadedness     Occasional. Only one episode in 08/2012 when he was not in [employer] of presyncope that was transient.  .  Hypertension     mild  . Impotence     mild  . Sick sinus syndrome ([place])     Significant bradycardia with prolonged asystole ans syncope, prompting his pacemaker implant in 2005.  Marland Kitchen Neurocardiogenic syncope     Hx of chronic neurocardiogenic syncope and he has had a negative tilt-table in the past, but significant bradycardia with prolonged asystole ans syncope, prompting his pacemaker implant in 2005.  Brent Stuart (acromioclavicular) joint bone spurs     On feet  . Left knee pain   . Truncal obesity   . Exogenous obesity     mild  . Plantar fasciitis   . Complication of anesthesia     SHAKING AFTER WAKING UP FROM SURGERY   . Pacemaker   . RBBB   . OSA on CPAP     SETTINGS AT 8.5   . Anxiety   . GERD (gastroesophageal reflux disease)     Past Surgical History  Procedure Laterality Date  . Pacemaker insertion  07/18/03    Medtronic Enpulse [id number] implanted by Dr Rollene Fare  . Colon surgery  1994    By Dr. Leafy Kindle for nonmalignant disease with associated appendectomy  . Appendectomy  1994  . Decompressive lumbar laminectomy level 3    . Decompressive lumbar laminectomy level 4    . Microdiscectomy lumbar    . Back surgery  10/21/2006    Foraminotomy  . Bunionectomy Right     First MTP area  . Septoplasty    . Robotic assited partial nephrectomy Left 10/19/2013    Procedure: ROBOTIC ASSISTED  LAPAROSCOPIC LEFT PARTIAL NEPHRECTOMY, EXSTENSIVE LAPAROSCOPIC LYSIS OF ADHESIONS;  Surgeon: Ardis Hughs, MD;  Location: [facility];  Service: Urology;  Laterality: Left;  . Permanent pacemaker generator change N/A 12/06/2013    Procedure: PERMANENT PACEMAKER GENERATOR CHANGE;  Surgeon: Sanda Klein, MD;  Location: [facility];  Service: Cardiovascular;  Laterality: N/A;    Outpatient Prescriptions Prior to Visit  Medication Sig Dispense Refill  . acetaminophen (TYLENOL) 500 MG tablet Take 1,000 mg by mouth every 6 (six) hours as needed (pain). Reported on 06/08/2015    . ALPRAZolam (XANAX) 0.25  MG tablet Take 0.125 mg by mouth 2 (two) times daily as needed for anxiety.     . hydroxypropyl methylcellulose / hypromellose (ISOPTO TEARS / GONIOVISC) 2.5 % ophthalmic solution Place 1 drop into both eyes daily as needed for dry eyes. Reported on 06/08/2015    . ibuprofen (ADVIL,MOTRIN) 200 MG tablet Take 400-600 mg by mouth every 6 (six) hours as needed (pain).     . metoprolol tartrate (LOPRESSOR) 25 MG tablet TAKE 1 TABLET BY MOUTH TWICE DAILY 60 tablet 0  . methocarbamol (ROBAXIN) 500 MG tablet Take 500 mg by mouth 2 (two) times daily as needed for muscle spasms. Reported on 06/08/2015     No facility-administered medications prior to visit.     Allergies:   Penicillins; Septra; and Zoloft   Social History   Social History  . Marital Status: Married    Spouse Name: N/A  . Number of Children: N/A  . Years of Education: N/A   Social History Main Topics  . Smoking status: Never Smoker   . Smokeless tobacco: Never Used  . Alcohol Use: No  . Drug Use: No  . Sexual Activity: Not Asked   Other Topics Concern  . None   Social History Narrative   Previously a IT trainer      ROS:   Please see the history of present illness.    ROS All other systems reviewed and are negative.   PHYSICAL EXAM:   VS:  BP 144/92 mmHg  Pulse 76  Ht 6\' 2"  (1.88 m)  Wt 99.848 kg (220 lb 2 oz)  BMI 28.25 kg/m2   GEN: Well nourished, well developed, in no acute distress HEENT: normal Neck: no JVD, carotid bruits, or masses Cardiac: RRR; no murmurs, rubs, or gallops,no edema ; healthy pacemaker left subclavian area Respiratory:  clear to auscultation bilaterally, normal work of breathing GI: soft, nontender, nondistended, + BS MS: no deformity or atrophy Skin: warm and dry, no rash Neuro:  Alert and Oriented x 3, Strength and sensation are intact Psych: euthymic mood, full affect  Wt Readings from Last 3 Encounters:  06/08/15 99.848 kg (220 lb 2 oz)  01/17/14 93.895 kg (207 lb)    12/22/13 93.668 kg (206 lb 8 oz)      Studies/Labs Reviewed:   EKG:  EKG is ordered today.  The ekg ordered today demonstrates Normal sinus rhythm, old RBBB   ASSESSMENT:    1. Neurocardiogenic syncope   2. Pacemaker   3. Essential hypertension   4. Sleep apnea   5. Overweight (BMI 25.0-29.9)      PLAN:  In order of problems listed above:  1. Neurally mediated syncope: While the pacemaker can prevent cardioinhibitory responses, his occasional episodes of flushing and weakness are likely related to vasodepressor events. He has not had full-blown syncope since pacemaker implantation. Discussed how to help prevent these events by staying well-hydrated and avoiding  triggers if he can identified. 2. PPM: Normal device function, continue remote download severe [facility] every 3 months. 3. HTN: His blood pressure is inching up, but I would like to avoid additional medication and reverse his weight gain and increase activity level to control his blood pressure. Diuretics should not be used and vasodilators should also be avoided, since these will increase his tendency for vasopressor events. If his blood pressure does not improve with weight loss, then increasing the beta blocker would probably be the best response (but would also be expected to worsen his erectile dysfunction). 4. OSA: He is compliant with CPAP. He states that he will need some new equipment from his provider. 5. He has gained 13 pounds since his last appointment and needs to reverse this trend. Ideally weight will be 200 pounds by this time next year.   Medication Adjustments/Labs and Tests Ordered: Current medicines are reviewed at length with the patient today.  Concerns regarding medicines are outlined above.  Medication changes, Labs and Tests ordered today are listed in the Patient Instructions below. Patient Instructions  Your physician recommends that you continue on your current medications as directed. Please  refer to the Current Medication list given to you today.  Remote monitoring is used to monitor your Pacemaker of ICD from home. This monitoring reduces the number of office visits required to check your device to one time per year. It allows [place] to keep an eye on the functioning of your device to ensure it is working properly. You are scheduled for a device check from home on September 09, 2015. You may send your transmission at any time that day. If you have a wireless device, the transmission will be sent automatically. After your physician reviews your transmission, you will receive a postcard with your next transmission date.  Dr Sallyanne Kuster recommends that you schedule a follow-up appointment in 12 months with a device check. You will receive a reminder letter in the mail two months in advance. If you don't receive a letter, please call our office to schedule the follow-up appointment.  Your physician encouraged you to lose weight for better health.  If you need a refill on your cardiac medications before your next appointment, please call your pharmacy.       Mikael Spray, MD  06/09/2015 1:03 PM    [facility] [address], [place], [place]  [address] Phone: [phone]; Fax: [phone]

## 2015-06-08 NOTE — Patient Instructions (Signed)
Your physician recommends that you continue on your current medications as directed. Please refer to the Current Medication list given to you today.  Remote monitoring is used to monitor your Pacemaker of ICD from home. This monitoring reduces the number of office visits required to check your device to one time per year. It allows [place] to keep an eye on the functioning of your device to ensure it is working properly. You are scheduled for a device check from home on September 09, 2015. You may send your transmission at any time that day. If you have a wireless device, the transmission will be sent automatically. After your physician reviews your transmission, you will receive a postcard with your next transmission date.  Dr Sallyanne Kuster recommends that you schedule a follow-up appointment in 12 months with a device check. You will receive a reminder letter in the mail two months in advance. If you don't receive a letter, please call our office to schedule the follow-up appointment.  Your physician encouraged you to lose weight for better health.  If you need a refill on your cardiac medications before your next appointment, please call your pharmacy.

## 2015-06-13 LAB — CUP PACEART INCLINIC DEVICE CHECK
Brady Statistic AP VS Percent: 1 %
Brady Statistic AS VP Percent: 0 %
Brady Statistic AS VS Percent: 99 %
Date Time Interrogation Session: 20170324154749
Implantable Lead Implant Date: 20050503
Implantable Lead Location: 753860
Lead Channel Impedance Value: 572 Ohm
Lead Channel Setting Pacing Amplitude: 1.5 V
Lead Channel Setting Pacing Amplitude: 4 V
Lead Channel Setting Pacing Pulse Width: 0.4 ms
MDC IDC LEAD IMPLANT DT: 20050503
MDC IDC LEAD LOCATION: 753859
MDC IDC MSMT BATTERY IMPEDANCE: 112 Ohm
MDC IDC MSMT BATTERY REMAINING LONGEVITY: 169 mo
MDC IDC MSMT BATTERY VOLTAGE: 2.79 V
MDC IDC MSMT LEADCHNL RV IMPEDANCE VALUE: 775 Ohm
MDC IDC SET LEADCHNL RV SENSING SENSITIVITY: 5.6 mV
MDC IDC STAT BRADY AP VP PERCENT: 0 %

## 2015-07-06 ENCOUNTER — Other Ambulatory Visit: Payer: Self-pay | Admitting: Cardiovascular Disease

## 2015-07-06 NOTE — Telephone Encounter (Signed)
Rx(s) sent to pharmacy electronically.  

## 2015-09-10 ENCOUNTER — Ambulatory Visit (INDEPENDENT_AMBULATORY_CARE_PROVIDER_SITE_OTHER): Payer: Commercial Managed Care - HMO | Admitting: *Deleted

## 2015-09-10 DIAGNOSIS — I495 Sick sinus syndrome: Secondary | ICD-10-CM | POA: Diagnosis not present

## 2015-09-10 NOTE — Progress Notes (Signed)
Remote pacemaker transmission.   

## 2015-09-11 LAB — CUP PACEART REMOTE DEVICE CHECK
Battery Impedance: 137 Ohm
Battery Remaining Longevity: 143 mo
Brady Statistic AP VP Percent: 0 %
Brady Statistic AP VS Percent: 1 %
Brady Statistic AS VS Percent: 99 %
Implantable Lead Implant Date: 20050503
Implantable Lead Location: 753859
Implantable Lead Location: 753860
Implantable Lead Model: 5594
Lead Channel Impedance Value: 536 Ohm
Lead Channel Impedance Value: 665 Ohm
Lead Channel Pacing Threshold Amplitude: 0.5 V
Lead Channel Pacing Threshold Pulse Width: 0.4 ms
Lead Channel Sensing Intrinsic Amplitude: 2.8 mV
Lead Channel Sensing Intrinsic Amplitude: 8 mV
Lead Channel Setting Pacing Amplitude: 1.5 V
Lead Channel Setting Pacing Amplitude: 5 V
Lead Channel Setting Sensing Sensitivity: 4 mV
MDC IDC LEAD IMPLANT DT: 20050503
MDC IDC MSMT BATTERY VOLTAGE: 2.78 V
MDC IDC SESS DTM: 20170626121127
MDC IDC SET LEADCHNL RV PACING PULSEWIDTH: 1 ms
MDC IDC STAT BRADY AS VP PERCENT: 0 %

## 2015-09-12 ENCOUNTER — Encounter: Payer: Self-pay | Admitting: Cardiology

## 2015-09-13 IMAGING — CR DG CHEST 2V
2 series · 2 of 2 positions shown · non-contrast
Comparison: 10/20/2006

CLINICAL DATA: Preoperative rest or exam.  Hypertension.

EXAM:
CHEST  2 VIEW

[w chest pa]
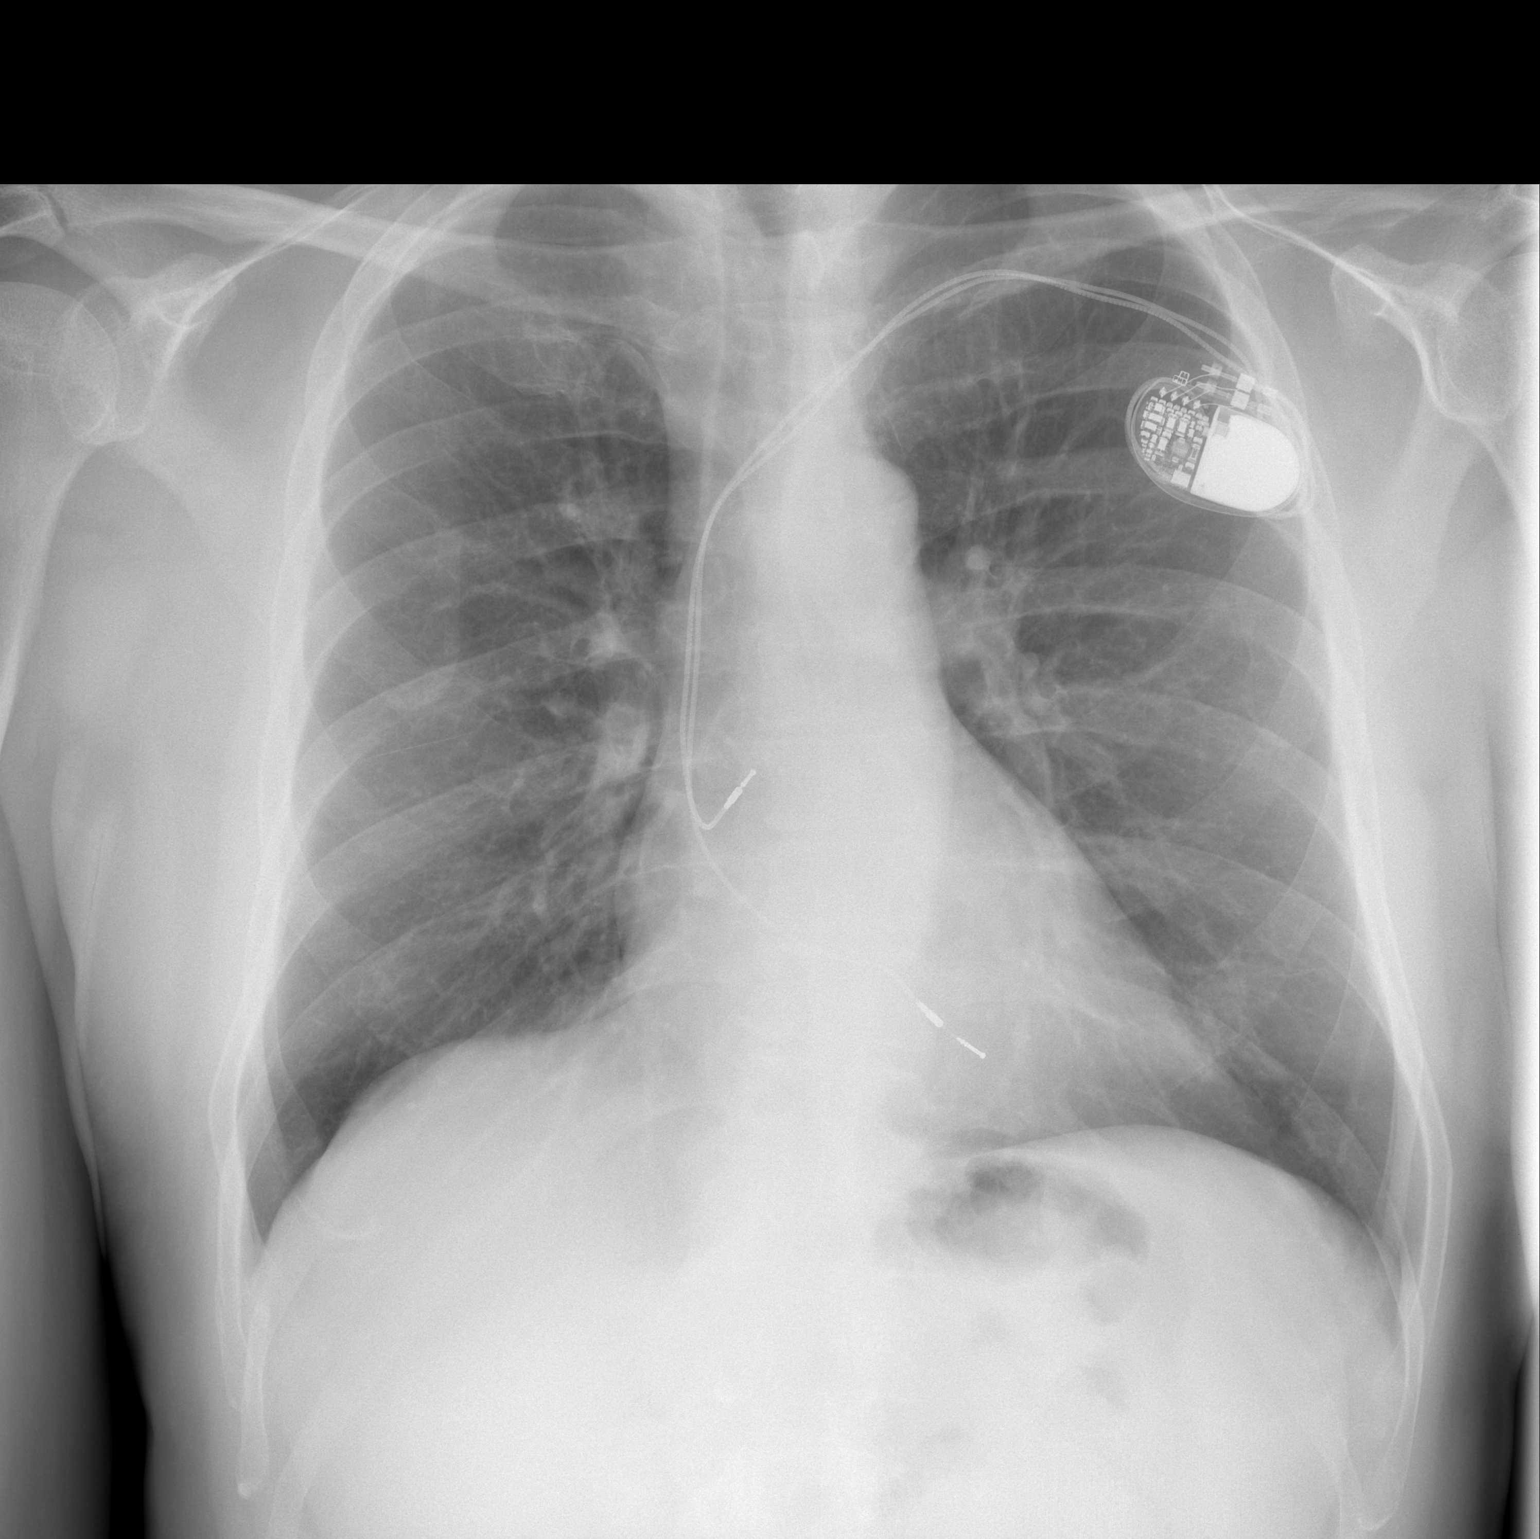

[w chest lat]
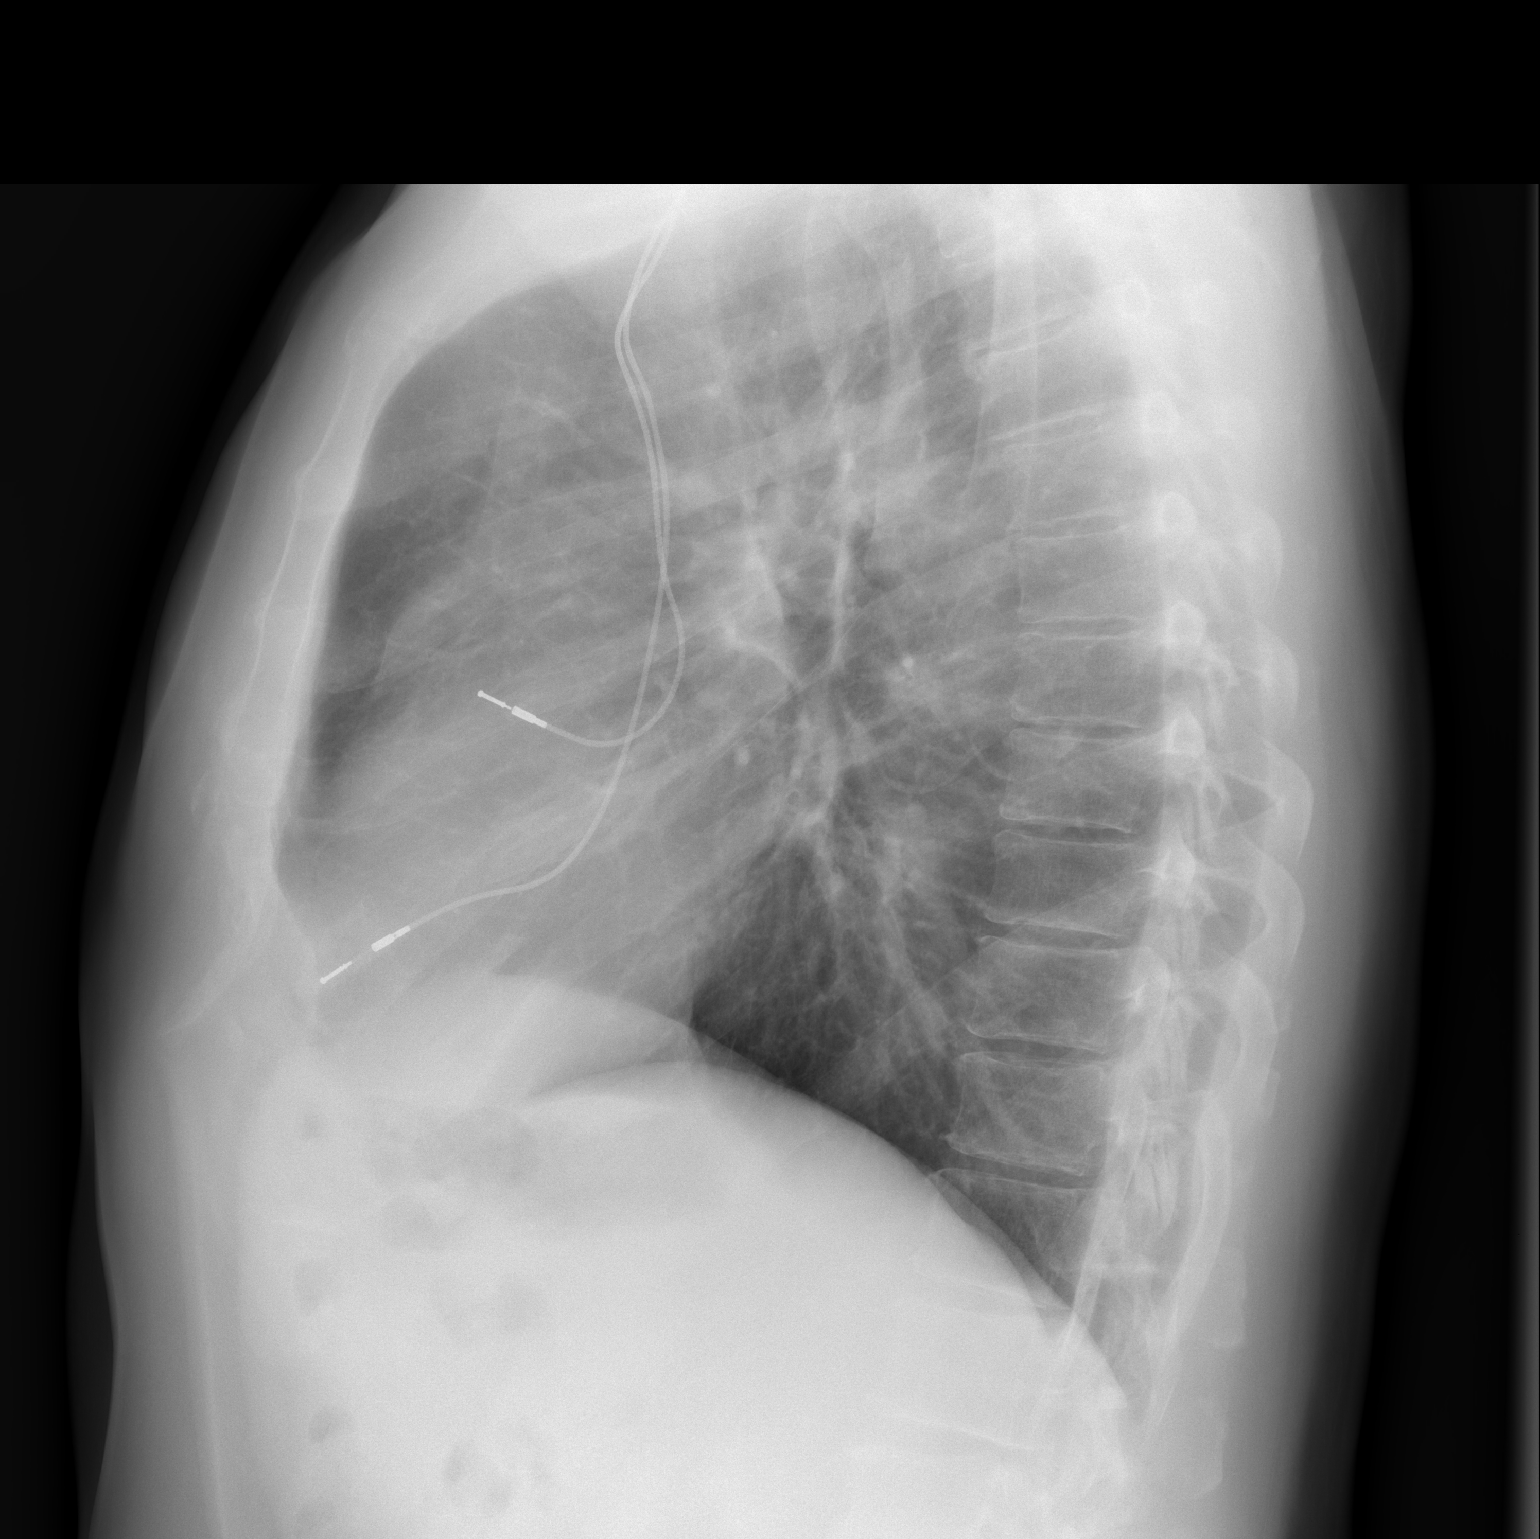

[2 of 2 positions shown; findings below may reference images not displayed]

FINDINGS: Heart size is normal. Delete pacemaker appears grossly well
positioned. Mediastinal shadows are otherwise normal. The lungs are
clear. The vascularity is normal. No effusions. No bony
abnormalities.
IMPRESSION: No active disease.  Dual lead pacemaker.

## 2015-12-10 ENCOUNTER — Telehealth: Payer: Self-pay | Admitting: Cardiology

## 2015-12-10 ENCOUNTER — Ambulatory Visit (INDEPENDENT_AMBULATORY_CARE_PROVIDER_SITE_OTHER): Payer: Commercial Managed Care - HMO | Admitting: *Deleted

## 2015-12-10 DIAGNOSIS — I495 Sick sinus syndrome: Secondary | ICD-10-CM

## 2015-12-10 NOTE — Progress Notes (Signed)
Remote pacemaker transmission.   

## 2015-12-10 NOTE — Telephone Encounter (Signed)
LMOVM reminding pt to send remote transmission.   

## 2015-12-12 ENCOUNTER — Encounter: Payer: Self-pay | Admitting: Cardiology

## 2015-12-26 ENCOUNTER — Encounter: Payer: Self-pay | Admitting: Cardiology

## 2016-01-03 LAB — CUP PACEART REMOTE DEVICE CHECK
Battery Impedance: 137 Ohm
Battery Remaining Longevity: 144 mo
Battery Voltage: 2.78 V
Implantable Lead Implant Date: 20050503
Implantable Lead Implant Date: 20050503
Implantable Lead Location: 753859
Implantable Lead Model: 5092
Lead Channel Impedance Value: 738 Ohm
Lead Channel Pacing Threshold Amplitude: 0.5 V
Lead Channel Sensing Intrinsic Amplitude: 8 mV
Lead Channel Setting Pacing Amplitude: 1.5 V
Lead Channel Setting Pacing Amplitude: 5 V
Lead Channel Setting Pacing Pulse Width: 1 ms
MDC IDC LEAD LOCATION: 753860
MDC IDC MSMT LEADCHNL RA IMPEDANCE VALUE: 555 Ohm
MDC IDC MSMT LEADCHNL RA PACING THRESHOLD PULSEWIDTH: 0.4 ms
MDC IDC MSMT LEADCHNL RA SENSING INTR AMPL: 2.8 mV
MDC IDC SESS DTM: 20170925151718
MDC IDC SET LEADCHNL RV SENSING SENSITIVITY: 4 mV
MDC IDC STAT BRADY AP VP PERCENT: 0 %
MDC IDC STAT BRADY AP VS PERCENT: 1 %
MDC IDC STAT BRADY AS VP PERCENT: 0 %
MDC IDC STAT BRADY AS VS PERCENT: 99 %

## 2016-02-02 ENCOUNTER — Other Ambulatory Visit: Payer: Self-pay | Admitting: Cardiovascular Disease

## 2016-03-03 ENCOUNTER — Other Ambulatory Visit: Payer: Self-pay | Admitting: Cardiovascular Disease

## 2016-03-13 ENCOUNTER — Ambulatory Visit (INDEPENDENT_AMBULATORY_CARE_PROVIDER_SITE_OTHER): Payer: Commercial Managed Care - HMO | Admitting: *Deleted

## 2016-03-13 DIAGNOSIS — R55 Syncope and collapse: Secondary | ICD-10-CM

## 2016-03-13 NOTE — Progress Notes (Signed)
Remote pacemaker transmission.   

## 2016-03-14 ENCOUNTER — Encounter: Payer: Self-pay | Admitting: Cardiology

## 2016-03-21 LAB — CUP PACEART REMOTE DEVICE CHECK
Battery Impedance: 137 Ohm
Battery Remaining Longevity: 143 mo
Battery Voltage: 2.79 V
Brady Statistic AP VP Percent: 0 %
Brady Statistic AP VS Percent: 1 %
Brady Statistic AS VP Percent: 0 %
Brady Statistic AS VS Percent: 98 %
Date Time Interrogation Session: 20171228125442
Implantable Lead Implant Date: 20050503
Implantable Lead Implant Date: 20050503
Implantable Lead Location: 753859
Implantable Lead Location: 753860
Implantable Lead Model: 5092
Implantable Lead Model: 5594
Implantable Pulse Generator Implant Date: 20150922
Lead Channel Impedance Value: 555 Ohm
Lead Channel Impedance Value: 708 Ohm
Lead Channel Pacing Threshold Amplitude: 0.5 V
Lead Channel Pacing Threshold Pulse Width: 0.4 ms
Lead Channel Setting Pacing Amplitude: 1.5 V
Lead Channel Setting Pacing Amplitude: 5 V
Lead Channel Setting Pacing Pulse Width: 1 ms
Lead Channel Setting Sensing Sensitivity: 4 mV

## 2016-03-24 ENCOUNTER — Encounter: Payer: Self-pay | Admitting: Cardiology

## 2016-03-24 ENCOUNTER — Telehealth: Payer: Self-pay | Admitting: Cardiovascular Disease

## 2016-03-24 MED ORDER — METOPROLOL TARTRATE 25 MG PO TABS: 25.0000 mg | ORAL_TABLET | Freq: Two times a day (BID) | ORAL | 2 refills | Status: DC

## 2016-03-24 NOTE — Telephone Encounter (Signed)
Rx(s) sent to pharmacy electronically.  

## 2016-03-24 NOTE — Telephone Encounter (Signed)
°  New Prob   *STAT* If patient is at the pharmacy, call can be transferred to refill team.   1. Which medications need to be refilled? (please list name of each medication and dose if known)  Metoprolol 25 mg  2. Which pharmacy/location (including street and city if local pharmacy) is medication to be sent to? [facility] on [address].  3. Do they need a 30 day or 90 day supply? 90 days   Pt is due to follow with Dr. Loletha Grayer at the end of March. Pt called today to schedule that appointment. No slots available at this time so pt will be scheduled for beginning of April once the schedule is available. He has agreed to call us back at the end of January to schedule his follow up appointment.

## 2016-04-01 ENCOUNTER — Other Ambulatory Visit: Payer: Self-pay | Admitting: Cardiovascular Disease

## 2016-04-25 ENCOUNTER — Other Ambulatory Visit: Payer: Self-pay | Admitting: Cardiovascular Disease

## 2016-05-25 ENCOUNTER — Other Ambulatory Visit: Payer: Self-pay | Admitting: Cardiovascular Disease

## 2016-06-02 ENCOUNTER — Ambulatory Visit (HOSPITAL_COMMUNITY)
Admission: RE | Admit: 2016-06-02 | Discharge: 2016-06-02 | Disposition: A | Discharge status: Home / Self Care | Payer: Commercial Managed Care - HMO | Source: Ambulatory Visit | Attending: Urology | Admitting: Urology

## 2016-06-02 ENCOUNTER — Other Ambulatory Visit: Payer: Self-pay | Admitting: Urology

## 2016-06-02 DIAGNOSIS — C642 Malignant neoplasm of left kidney, except renal pelvis: Secondary | ICD-10-CM

## 2016-06-26 ENCOUNTER — Ambulatory Visit (INDEPENDENT_AMBULATORY_CARE_PROVIDER_SITE_OTHER): Payer: Commercial Managed Care - HMO | Admitting: Cardiovascular Disease

## 2016-06-26 ENCOUNTER — Encounter: Payer: Self-pay | Admitting: Cardiovascular Disease

## 2016-06-26 VITALS — BP 132/82 | HR 70 | Ht 74.0 in | Wt 213.0 lb

## 2016-06-26 DIAGNOSIS — R55 Syncope and collapse: Secondary | ICD-10-CM | POA: Diagnosis not present

## 2016-06-26 DIAGNOSIS — E663 Overweight: Secondary | ICD-10-CM | POA: Diagnosis not present

## 2016-06-26 DIAGNOSIS — I10 Essential (primary) hypertension: Secondary | ICD-10-CM

## 2016-06-26 DIAGNOSIS — Z950 Presence of cardiac pacemaker: Secondary | ICD-10-CM

## 2016-06-26 DIAGNOSIS — G4733 Obstructive sleep apnea (adult) (pediatric): Secondary | ICD-10-CM | POA: Diagnosis not present

## 2016-06-26 NOTE — Patient Instructions (Addendum)
Dr Sallyanne Kuster has recommended making the following medication changes: 1. TAKE Metoprolol at 6 am and 6 pm 2. TAKE Valsartan at bedtime  Remote monitoring is used to monitor your Pacemaker of ICD from home. This monitoring reduces the number of office visits required to check your device to one time per year. It allows [place] to keep an eye on the functioning of your device to ensure it is working properly. You are scheduled for a device check from home on Thursday, July 12th, 2018. You may send your transmission at any time that day. If you have a wireless device, the transmission will be sent automatically. After your physician reviews your transmission, you will receive a postcard with your next transmission date.  Dr Sallyanne Kuster recommends that you schedule a follow-up appointment in 12 months with a pacemaker check. You will receive a reminder letter in the mail two months in advance. If you don't receive a letter, please call our office to schedule the follow-up appointment.  If you need a refill on your cardiac medications before your next appointment, please call your pharmacy.

## 2016-06-26 NOTE — Progress Notes (Signed)
Patient ID: Brent Stuart, male   DOB: January 18, 1951, 66 y.o.   MRN: [id number]    Cardiology Office Note    Date:  06/27/2016   ID:  REGINA COPPOLINO, DOB [birth year], MRN [id number]  PCP:  Irven Shelling, MD  Cardiologist:   Sanda Klein, MD   Chief Complaint  Patient presents with  . Follow-up    pt has no complaints     History of Present Illness:  Brent Stuart is a 66 y.o. male with a history of pacemaker implanted for neurally mediated syncope and sinus pauses, originally implanted in 2005 with a generator change out in 2015. He also has problems with overweight, systemic hypertension and obstructive sleep apnea.  He has not had syncope since pacemaker implantation but has occasional episodes of flushing and weakness which sounds like vasodepressor near-syncope. These are mild and easily reversed by laying down. He denies dyspnea, edema, angina, palpitations, focal neurological events and claudication.  He reports his BP is rather high due to recent emotional stressors. His mother is receiving chemotherapy. He is her transporter and primary caregiver. He is finally completing his home sale, obligated by DOT for the [place]. He has had some high spirited meetings with lawyers over this topic.  Because of high BP, valsartan was added in an increased dose of 160 mg and he predictably gets a headache 3 hours after dosing this. He cut the dose back to 80 mg, but still has headaches  Pacemaker interrogation shows normal device function. The device is programmed DDD 50 bpm and he only has 1.5% atrial pacing and 0.1% ventricular pacing (numerous episodes of sudden bradycardia response). There've been several episodes of high ventricular rate, likely sinus tachycardia, on Friday 4/6 (heated lawyer meeting that day). Heart rate histogram is favorable, there is no evidence of sinus node dysfunction.  Ventricular lead threshold has been "high" since February 2016.  Thankfully he never requires ventricular pacing and there has been no recurrence of syncope. When his pacemaker was implanted in 2005 he had sinus pauses, no evidence of AV block. Generator change was performed September 2015; thresholds were still okay in November 2015    Past Medical History:  Diagnosis Date  . AC (acromioclavicular) joint bone spurs    On feet  . Anxiety   . Complication of anesthesia    SHAKING AFTER WAKING UP FROM SURGERY   . Exogenous obesity    mild  . GERD (gastroesophageal reflux disease)   . Hypertension    mild  . Impotence    mild  . Left knee pain   . Neurocardiogenic syncope    Hx of chronic neurocardiogenic syncope and he has had a negative tilt-table in the past, but significant bradycardia with prolonged asystole ans syncope, prompting his pacemaker implant in 2005.  . Orthostatic lightheadedness    Occasional. Only one episode in 08/2012 when he was not in [employer] of presyncope that was transient.  . OSA on CPAP    SETTINGS AT 8.5   . Pacemaker   . Plantar fasciitis   . RBBB   . Sick sinus syndrome ([place])    Significant bradycardia with prolonged asystole ans syncope, prompting his pacemaker implant in 2005.  . Truncal obesity     Past Surgical History:  Procedure Laterality Date  . APPENDECTOMY  1994  . BACK SURGERY  10/21/2006   Foraminotomy  . BUNIONECTOMY Right    First MTP area  . [place]  By Dr. Leafy Kindle for nonmalignant disease with associated appendectomy  . DECOMPRESSIVE LUMBAR LAMINECTOMY LEVEL 3    . DECOMPRESSIVE LUMBAR LAMINECTOMY LEVEL 4    . MICRODISCECTOMY LUMBAR    . PACEMAKER INSERTION  07/18/03   Medtronic Enpulse O2V035 implanted by Dr Rollene Fare  . PERMANENT PACEMAKER GENERATOR CHANGE N/A 12/06/2013   Procedure: PERMANENT PACEMAKER GENERATOR CHANGE;  Surgeon: Sanda Klein, MD;  Location: [facility];  Service: Cardiovascular;  Laterality: N/A;  . ROBOTIC ASSITED PARTIAL NEPHRECTOMY Left 10/19/2013    Procedure: ROBOTIC ASSISTED LAPAROSCOPIC LEFT PARTIAL NEPHRECTOMY, EXSTENSIVE LAPAROSCOPIC LYSIS OF ADHESIONS;  Surgeon: Ardis Hughs, MD;  Location: [facility];  Service: Urology;  Laterality: Left;  . SEPTOPLASTY      Outpatient Medications Prior to Visit  Medication Sig Dispense Refill  . acetaminophen (TYLENOL) 500 MG tablet Take 1,000 mg by mouth every 6 (six) hours as needed (pain). Reported on 06/08/2015    . ALPRAZolam (XANAX) 0.25 MG tablet Take 0.125 mg by mouth 2 (two) times daily as needed for anxiety.     Marland Kitchen CIALIS 20 MG tablet Take 1 tablet by mouth as needed. Use as directed take 1 tab by mouth as needed  11  . cyclobenzaprine (FLEXERIL) 10 MG tablet Take 10 mg by mouth 3 (three) times daily as needed for muscle spasms.    . hydroxypropyl methylcellulose / hypromellose (ISOPTO TEARS / GONIOVISC) 2.5 % ophthalmic solution Place 1 drop into both eyes daily as needed for dry eyes. Reported on 06/08/2015    . ibuprofen (ADVIL,MOTRIN) 200 MG tablet Take 400-600 mg by mouth every 6 (six) hours as needed (pain).     . metoprolol tartrate (LOPRESSOR) 25 MG tablet TAKE 1 TABLET BY MOUTH TWICE DAILY 60 tablet 0  . metoprolol tartrate (LOPRESSOR) 25 MG tablet Take 1 tablet (25 mg total) by mouth 2 (two) times daily. 60 tablet 2  . metoprolol tartrate (LOPRESSOR) 25 MG tablet TAKE 1 TABLET BY MOUTH TWICE DAILY 180 tablet 0   No facility-administered medications prior to visit.      Allergies:   Penicillins; Septra [sulfamethoxazole-trimethoprim]; and Zoloft [sertraline hcl]   Social History   Social History  . Marital status: Married    Spouse name: N/A  . Number of children: N/A  . Years of education: N/A   Social History Main Topics  . Smoking status: Never Smoker  . Smokeless tobacco: Never Used  . Alcohol use No  . Drug use: No  . Sexual activity: Not Asked   Other Topics Concern  . None   Social History Narrative   Previously a IT trainer      ROS:   Please see  the history of present illness.    ROS All other systems reviewed and are negative.   PHYSICAL EXAM:   VS:  BP 132/82 (BP Location: Left Arm, Patient Position: Sitting, Cuff Size: Normal)   Pulse 70   Ht 6\' 2"  (1.88 m)   Wt 96.6 kg (213 lb)   BMI 27.35 kg/m    GEN: Well nourished, well developed, in no acute distress  HEENT: normal  Neck: no JVD, carotid bruits, or masses Cardiac: RRR; no murmurs, rubs, or gallops,no edema ; healthy pacemaker left subclavian area Respiratory:  clear to auscultation bilaterally, normal work of breathing GI: soft, nontender, nondistended, + BS MS: no deformity or atrophy  Skin: warm and dry, no rash Neuro:  Alert and Oriented x 3, Strength and sensation are intact Psych: euthymic mood,  full affect  Wt Readings from Last 3 Encounters:  06/26/16 96.6 kg (213 lb)  06/08/15 99.8 kg (220 lb 2 oz)  01/17/14 93.9 kg (207 lb)      Studies/Labs Reviewed:   EKG:  EKG is ordered today.  The ekg ordered today demonstrates Normal sinus rhythm, old RBBB   ASSESSMENT:    1. Neurocardiogenic syncope   2. Pacemaker   3. Essential hypertension   4. Obstructive sleep apnea syndrome   5. Overweight (BMI 25.0-29.9)      PLAN:  In order of problems listed above:  1. Neurally mediated syncope: While the pacemaker can prevent cardioinhibitory responses, his occasional episodes of flushing and weakness are likely related to vasodepressor events. He has not had full-blown syncope since pacemaker implantation. Discussed how to help prevent these events by staying well-hydrated and avoiding triggers if he can identified. 2. PPM: high V pacing thresholds, but without meaningful consequences for pacemaker function or clinical events. Normal device function, continue remote download via [employer] every 3 months. 3. HTN: Fair control of his blood pressure. Diuretics should not be used and vasodilators should also be avoided, since these will increase his tendency for  vasopressor events. If his blood pressure worsens, then increasing the beta blocker would probably be the best response (but would also be expected to worsen his erectile dysfunction). Will time the valsartan dosing at bedtime to avoid headaches during the day. 4. OSA: He is compliant with CPAP.  5. Overweight: He has lost 7 pounds since last year, 31 pounds since 2015 and needs to continue this trend. Ideally weight will be 200 pounds by this time next year.   Medication Adjustments/Labs and Tests Ordered: Current medicines are reviewed at length with the patient today.  Concerns regarding medicines are outlined above.  Medication changes, Labs and Tests ordered today are listed in the Patient Instructions below. Patient Instructions  Dr Sallyanne Kuster has recommended making the following medication changes: 1. TAKE Metoprolol at 6 am and 6 pm 2. TAKE Valsartan at bedtime  Remote monitoring is used to monitor your Pacemaker of ICD from home. This monitoring reduces the number of office visits required to check your device to one time per year. It allows [place] to keep an eye on the functioning of your device to ensure it is working properly. You are scheduled for a device check from home on Thursday, July 12th, 2018. You may send your transmission at any time that day. If you have a wireless device, the transmission will be sent automatically. After your physician reviews your transmission, you will receive a postcard with your next transmission date.  Dr Sallyanne Kuster recommends that you schedule a follow-up appointment in 12 months with a pacemaker check. You will receive a reminder letter in the mail two months in advance. If you don't receive a letter, please call our office to schedule the follow-up appointment.  If you need a refill on your cardiac medications before your next appointment, please call your pharmacy.      Signed, Sanda Klein, MD  06/27/2016 1:57 PM    [facility]  [facility] [address], [place], [place]  [address] Phone: [phone]; Fax: [phone]

## 2016-07-09 LAB — CUP PACEART INCLINIC DEVICE CHECK
Battery Impedance: 137 Ohm
Battery Voltage: 2.79 V
Brady Statistic AP VP Percent: 0 %
Brady Statistic AP VS Percent: 1 %
Brady Statistic AS VP Percent: 0 %
Brady Statistic AS VS Percent: 98 %
Implantable Lead Implant Date: 20050503
Implantable Lead Location: 753860
Implantable Lead Model: 5092
Lead Channel Impedance Value: 545 Ohm
Lead Channel Impedance Value: 665 Ohm
Lead Channel Pacing Threshold Amplitude: 0.5 V
Lead Channel Setting Pacing Amplitude: 1.5 V
Lead Channel Setting Pacing Amplitude: 5 V
Lead Channel Setting Pacing Pulse Width: 1 ms
Lead Channel Setting Sensing Sensitivity: 4 mV
MDC IDC LEAD IMPLANT DT: 20050503
MDC IDC LEAD LOCATION: 753859
MDC IDC MSMT BATTERY REMAINING LONGEVITY: 143 mo
MDC IDC MSMT LEADCHNL RA PACING THRESHOLD PULSEWIDTH: 0.4 ms
MDC IDC PG IMPLANT DT: 20150922
MDC IDC SESS DTM: 20180412192418

## 2016-09-18 ENCOUNTER — Other Ambulatory Visit: Payer: Self-pay | Admitting: Cardiovascular Disease

## 2016-09-25 ENCOUNTER — Telehealth: Payer: Self-pay | Admitting: Cardiology

## 2016-09-25 ENCOUNTER — Ambulatory Visit (INDEPENDENT_AMBULATORY_CARE_PROVIDER_SITE_OTHER): Payer: 59 | Admitting: *Deleted

## 2016-09-25 DIAGNOSIS — I495 Sick sinus syndrome: Secondary | ICD-10-CM | POA: Diagnosis not present

## 2016-09-25 NOTE — Telephone Encounter (Signed)
Spoke with pt and reminded pt of remote transmission that is due today. Pt verbalized understanding.   

## 2016-09-26 NOTE — Progress Notes (Signed)
Remote pacemaker transmission.   

## 2016-09-30 ENCOUNTER — Encounter: Payer: Self-pay | Admitting: Cardiology

## 2016-10-14 ENCOUNTER — Encounter: Payer: Self-pay | Admitting: Cardiology

## 2016-10-18 LAB — CUP PACEART REMOTE DEVICE CHECK
Battery Impedance: 137 Ohm
Battery Remaining Longevity: 143 mo
Battery Voltage: 2.79 V
Brady Statistic AP VP Percent: 0 %
Brady Statistic AS VS Percent: 98 %
Implantable Lead Implant Date: 20050503
Implantable Lead Location: 753859
Implantable Lead Model: 5594
Lead Channel Pacing Threshold Amplitude: 0.5 V
Lead Channel Setting Pacing Amplitude: 1.5 V
Lead Channel Setting Pacing Pulse Width: 1 ms
MDC IDC LEAD IMPLANT DT: 20050503
MDC IDC LEAD LOCATION: 753860
MDC IDC MSMT LEADCHNL RA IMPEDANCE VALUE: 513 Ohm
MDC IDC MSMT LEADCHNL RA PACING THRESHOLD PULSEWIDTH: 0.4 ms
MDC IDC MSMT LEADCHNL RV IMPEDANCE VALUE: 622 Ohm
MDC IDC PG IMPLANT DT: 20150922
MDC IDC SESS DTM: 20180712230239
MDC IDC SET LEADCHNL RV PACING AMPLITUDE: 5 V
MDC IDC SET LEADCHNL RV SENSING SENSITIVITY: 2.8 mV
MDC IDC STAT BRADY AP VS PERCENT: 1 %
MDC IDC STAT BRADY AS VP PERCENT: 0 %

## 2016-11-26 ENCOUNTER — Telehealth: Payer: Self-pay | Admitting: *Deleted

## 2016-11-26 NOTE — Telephone Encounter (Signed)
error 

## 2016-12-25 ENCOUNTER — Ambulatory Visit (INDEPENDENT_AMBULATORY_CARE_PROVIDER_SITE_OTHER): Payer: 59 | Admitting: *Deleted

## 2016-12-25 DIAGNOSIS — I495 Sick sinus syndrome: Secondary | ICD-10-CM

## 2016-12-25 NOTE — Progress Notes (Signed)
Remote pacemaker transmission.   

## 2016-12-26 ENCOUNTER — Encounter: Payer: Self-pay | Admitting: Cardiology

## 2017-01-05 LAB — CUP PACEART REMOTE DEVICE CHECK
Battery Impedance: 137 Ohm
Battery Voltage: 2.78 V
Brady Statistic AP VP Percent: 0 %
Brady Statistic AS VP Percent: 0 %
Date Time Interrogation Session: 20181011114823
Implantable Lead Location: 753860
Implantable Lead Model: 5092
Implantable Lead Model: 5594
Implantable Pulse Generator Implant Date: 20150922
Lead Channel Pacing Threshold Pulse Width: 0.4 ms
Lead Channel Setting Pacing Amplitude: 1.5 V
Lead Channel Setting Pacing Amplitude: 5 V
Lead Channel Setting Pacing Pulse Width: 1 ms
Lead Channel Setting Sensing Sensitivity: 2.8 mV
MDC IDC LEAD IMPLANT DT: 20050503
MDC IDC LEAD IMPLANT DT: 20050503
MDC IDC LEAD LOCATION: 753859
MDC IDC MSMT BATTERY REMAINING LONGEVITY: 143 mo
MDC IDC MSMT LEADCHNL RA IMPEDANCE VALUE: 529 Ohm
MDC IDC MSMT LEADCHNL RA PACING THRESHOLD AMPLITUDE: 0.5 V
MDC IDC MSMT LEADCHNL RV IMPEDANCE VALUE: 616 Ohm
MDC IDC STAT BRADY AP VS PERCENT: 1 %
MDC IDC STAT BRADY AS VS PERCENT: 98 %

## 2017-01-09 ENCOUNTER — Encounter: Payer: Self-pay | Admitting: Cardiology

## 2017-03-26 ENCOUNTER — Ambulatory Visit (INDEPENDENT_AMBULATORY_CARE_PROVIDER_SITE_OTHER): Payer: 59 | Admitting: *Deleted

## 2017-03-26 DIAGNOSIS — I495 Sick sinus syndrome: Secondary | ICD-10-CM | POA: Diagnosis not present

## 2017-03-26 NOTE — Progress Notes (Signed)
Remote pacemaker transmission.   

## 2017-04-03 ENCOUNTER — Encounter: Payer: Self-pay | Admitting: Cardiology

## 2017-04-08 ENCOUNTER — Telehealth: Payer: Self-pay | Admitting: Cardiovascular Disease

## 2017-04-08 NOTE — Telephone Encounter (Signed)
I can do that, may need a form from his DME provider. MCr

## 2017-04-08 NOTE — Telephone Encounter (Signed)
Returned call to patient.He stated he needed a prescription for a new cpap mask and hose sent to [facility].Advised I will send message to Dr.Croitoru.

## 2017-04-08 NOTE — Telephone Encounter (Signed)
New Message   Patient is calling about obtaining a rx for his cpap machine supplies. Please call.

## 2017-04-27 LAB — CUP PACEART REMOTE DEVICE CHECK
Battery Impedance: 137 Ohm
Battery Voltage: 2.79 V
Brady Statistic AP VP Percent: 0 %
Brady Statistic AP VS Percent: 1 %
Brady Statistic AS VP Percent: 0 %
Implantable Lead Implant Date: 20050503
Implantable Lead Implant Date: 20050503
Implantable Lead Location: 753859
Implantable Lead Model: 5092
Implantable Lead Model: 5594
Implantable Pulse Generator Implant Date: 20150922
Lead Channel Impedance Value: 627 Ohm
Lead Channel Pacing Threshold Amplitude: 0.375 V
Lead Channel Pacing Threshold Pulse Width: 0.4 ms
Lead Channel Setting Pacing Pulse Width: 1 ms
MDC IDC LEAD LOCATION: 753860
MDC IDC MSMT BATTERY REMAINING LONGEVITY: 143 mo
MDC IDC MSMT LEADCHNL RA IMPEDANCE VALUE: 530 Ohm
MDC IDC SESS DTM: 20190110150354
MDC IDC SET LEADCHNL RA PACING AMPLITUDE: 1.5 V
MDC IDC SET LEADCHNL RV PACING AMPLITUDE: 5 V
MDC IDC SET LEADCHNL RV SENSING SENSITIVITY: 2.8 mV
MDC IDC STAT BRADY AS VS PERCENT: 99 %

## 2017-05-31 ENCOUNTER — Other Ambulatory Visit: Payer: Self-pay | Admitting: Cardiovascular Disease

## 2017-06-01 NOTE — Telephone Encounter (Signed)
REFILL 

## 2017-06-25 ENCOUNTER — Ambulatory Visit (INDEPENDENT_AMBULATORY_CARE_PROVIDER_SITE_OTHER): Payer: 59 | Admitting: *Deleted

## 2017-06-25 DIAGNOSIS — I495 Sick sinus syndrome: Secondary | ICD-10-CM

## 2017-06-25 NOTE — Progress Notes (Signed)
Remote pacemaker transmission.   

## 2017-07-01 ENCOUNTER — Encounter: Payer: Self-pay | Admitting: Cardiology

## 2017-07-07 NOTE — Telephone Encounter (Signed)
Patient's sleep therapy is not managed by [place]. I do not see a sleep study or related appointments. Will defer for now as message is a old message.

## 2017-07-15 ENCOUNTER — Encounter

## 2017-07-15 ENCOUNTER — Ambulatory Visit: Payer: 59 | Admitting: Cardiology

## 2017-07-15 ENCOUNTER — Encounter: Payer: Self-pay | Admitting: Cardiology

## 2017-07-15 VITALS — BP 208/128 | HR 77 | Ht 73.5 in | Wt 230.0 lb

## 2017-07-15 DIAGNOSIS — I10 Essential (primary) hypertension: Secondary | ICD-10-CM

## 2017-07-15 DIAGNOSIS — Z950 Presence of cardiac pacemaker: Secondary | ICD-10-CM | POA: Diagnosis not present

## 2017-07-15 MED ORDER — CARVEDILOL 12.5 MG PO TABS: 12.5000 mg | ORAL_TABLET | Freq: Two times a day (BID) | ORAL | 6 refills | Status: AC

## 2017-07-15 MED ORDER — LOSARTAN POTASSIUM 100 MG PO TABS: 100.0000 mg | ORAL_TABLET | Freq: Every day | ORAL | 6 refills | Status: AC

## 2017-07-15 MED ORDER — CLONIDINE HCL 0.1 MG PO TABS: 0.1000 mg | ORAL_TABLET | Freq: Three times a day (TID) | ORAL | 6 refills | Status: AC

## 2017-07-15 NOTE — Progress Notes (Addendum)
07/15/2017 Brent Stuart   Aug 12, 1950  [id number]  Primary Physician Lavone Orn, MD Primary Cardiologist: Dr Sallyanne Kuster  HPI:  67 y/o followed by Dr Sallyanne Kuster with a history of pacemaker in 2005, generator change in 2015. His last OV was April 2018. He is in the office today because he has noticed his B/P is running high at home. He admits to being under stress, he is caring for his mother now. He increased his Losartan 3 days ago because he became concerned. He has had headaches, no unusual dyspnea. He does not follow a low sodium diet.    Current Outpatient Medications  Medication Sig Dispense Refill  . acetaminophen (TYLENOL) 500 MG tablet Take 1,000 mg by mouth every 6 (six) hours as needed (pain). Reported on 06/08/2015    . ALPRAZolam (XANAX) 0.25 MG tablet Take 0.125 mg by mouth 2 (two) times daily as needed for anxiety.     Marland Kitchen aspirin-acetaminophen-caffeine (EXCEDRIN MIGRAINE) 250-250-65 MG tablet Take by mouth as needed for headache or migraine.    Marland Kitchen CIALIS 20 MG tablet Take 1 tablet by mouth as needed. Use as directed take 1 tab by mouth as needed  11  . cyclobenzaprine (FLEXERIL) 10 MG tablet Take 10 mg by mouth 3 (three) times daily as needed for muscle spasms.    . hydroxypropyl methylcellulose / hypromellose (ISOPTO TEARS / GONIOVISC) 2.5 % ophthalmic solution Place 1 drop into both eyes daily as needed for dry eyes. Reported on 06/08/2015    . ibuprofen (ADVIL,MOTRIN) 200 MG tablet Take 400-600 mg by mouth every 6 (six) hours as needed (pain).     . metoprolol tartrate (LOPRESSOR) 25 MG tablet Take 1 tablet (25 mg total) by mouth 2 (two) times daily. KEEP OV. 180 tablet 0  . omeprazole (PRILOSEC) 20 MG capsule Take 20 mg by mouth daily.    . carvedilol (COREG) 12.5 MG tablet Take 1 tablet (12.5 mg total) by mouth 2 (two) times daily. 60 tablet 6  . cloNIDine (CATAPRES) 0.1 MG tablet Take 1 tablet (0.1 mg total) by mouth 3 (three) times daily. 90 tablet 6  . losartan (COZAAR)  100 MG tablet Take 1 tablet (100 mg total) by mouth daily. 30 tablet 6   No current facility-administered medications for this visit.     Allergies  Allergen Reactions  . Penicillins Hives  . Septra [Sulfamethoxazole-Trimethoprim] Other (See Comments)    Unknown  . Zoloft [Sertraline Hcl] Other (See Comments)    Unknown    Past Medical History:  Diagnosis Date  . AC (acromioclavicular) joint bone spurs    On feet  . Anxiety   . Complication of anesthesia    SHAKING AFTER WAKING UP FROM SURGERY   . Exogenous obesity    mild  . GERD (gastroesophageal reflux disease)   . Hypertension    mild  . Impotence    mild  . Left knee pain   . Neurocardiogenic syncope    Hx of chronic neurocardiogenic syncope and he has had a negative tilt-table in the past, but significant bradycardia with prolonged asystole ans syncope, prompting his pacemaker implant in 2005.  . Orthostatic lightheadedness    Occasional. Only one episode in 08/2012 when he was not in [employer] of presyncope that was transient.  . OSA on CPAP    SETTINGS AT 8.5   . Pacemaker   . Plantar fasciitis   . RBBB   . Sick sinus syndrome ([facility])    Significant bradycardia  with prolonged asystole ans syncope, prompting his pacemaker implant in 2005.  . Truncal obesity     Social History   Socioeconomic History  . Marital status: Married    Spouse name: Not on file  . Number of children: Not on file  . Years of education: Not on file  . Highest education level: Not on file  Occupational History  . Not on file  Social Needs  . Financial resource strain: Not on file  . Food insecurity:    Worry: Not on file    Inability: Not on file  . Transportation needs:    Medical: Not on file    Non-medical: Not on file  Tobacco Use  . Smoking status: Never Smoker  . Smokeless tobacco: Never Used  Substance and Sexual Activity  . Alcohol use: No  . Drug use: No  . Sexual activity: Not on file  Lifestyle  . Physical  activity:    Days per week: Not on file    Minutes per session: Not on file  . Stress: Not on file  Relationships  . Social connections:    Talks on phone: Not on file    Gets together: Not on file    Attends religious service: Not on file    Active member of club or organization: Not on file    Attends meetings of clubs or organizations: Not on file    Relationship status: Not on file  . Intimate partner violence:    Fear of current or ex partner: Not on file    Emotionally abused: Not on file    Physically abused: Not on file    Forced sexual activity: Not on file  Other Topics Concern  . Not on file  Social History Narrative   Previously a IT trainer     Family History  Problem Relation Age of Onset  . Hypertension Unknown      Review of Systems: General: negative for chills, fever, night sweats or weight changes.  Cardiovascular: negative for chest pain, dyspnea on exertion, edema, orthopnea, palpitations, paroxysmal nocturnal dyspnea or shortness of breath Dermatological: negative for rash Respiratory: negative for cough or wheezing Urologic: negative for hematuria Abdominal: negative for nausea, vomiting, diarrhea, bright red blood per rectum, melena, or hematemesis Neurologic: negative for visual changes, syncope, or dizziness All other systems reviewed and are otherwise negative except as noted above.    Blood pressure (!) 208/128, pulse 77, height 6' 1.5" (1.867 m), weight 230 lb (104.3 kg).  General appearance: alert, cooperative, no distress and mildly obese Neck: no carotid bruit and no JVD Lungs: clear to auscultation bilaterally Heart: regular rate and rhythm Extremities: extremities normal, atraumatic, no cyanosis or edema Skin: Skin color, texture, turgor normal. No rashes or lesions Neurologic: Grossly normal  EKG NSR, RBBB  ASSESSMENT AND PLAN:   Uncontrolled hypertension B/P 182/118 by me- pt has a list of recent B/P readings in that  trange  Pacemaker Medtronic, initial implant 2005,generator change 2015   PLAN  I suggested he continue the Losartan 100 mg. I added clonidine 0.1mg  TID and changed his Lopressor to Coreg 12.5 mg BID. I'll check a BMP today. He'll let us know Monday how his B/P is doing, if it is still uncontrolled I think he'll need to be seen. He has an appointment with dr Laurann Montana in 10 days, if his B/P is under better control next week he can just follow up with Dr Laurann Montana.   Long term I would  like to wean him off the clonidine and add a diuretic (depending on his renal function) and/or a Ca++ blocker. I would leave him on the Coreg as it has better anti hypertensive properties than Lopressor.   His pacemaker was checked today and appears to be functioning normally. I will have Dr Sallyanne Kuster review.   Kerin Ransom PA-C 07/15/2017 4:18 PM

## 2017-07-15 NOTE — Patient Instructions (Signed)
Medication Instructions:  DISCONTINUE Lopressor START Coreg 12.5mg  take 1 tablet twice a day START Clonidine 0.1mg  take 1 tablet three times a day   Labwork: Your physician recommends that you return for lab work in: TODAY-BMET  Testing/Procedures: NONE   Follow-Up: Your physician recommends that you schedule a follow-up appointment in: 3-4 [place], PA-C  Any Other Special Instructions Will Be Listed Below (If Applicable). CALL THE OFFICE Monday IF BLOOD PRESSURE IS NO BETTER. If you need a refill on your cardiac medications before your next appointment, please call your pharmacy.

## 2017-07-15 NOTE — Assessment & Plan Note (Signed)
B/P 182/118 by me- pt has a list of recent B/P readings in that trange

## 2017-07-15 NOTE — Assessment & Plan Note (Signed)
Medtronic, initial implant 2005,generator change 2015

## 2017-07-16 LAB — BASIC METABOLIC PANEL
BUN/Creatinine Ratio: 14 (ref 10–24)
BUN: 15 mg/dL (ref 8–27)
CO2: 23 mmol/L (ref 20–29)
Calcium: 9.5 mg/dL (ref 8.6–10.2)
Chloride: 101 mmol/L (ref 96–106)
Creatinine, Ser: 1.1 mg/dL (ref 0.76–1.27)
GFR calc Af Amer: 82 mL/min/{1.73_m2} (ref >59)
GFR calc non Af Amer: 71 mL/min/{1.73_m2} (ref >59)
Glucose: 88 mg/dL (ref 65–99)
Potassium: 4 mmol/L (ref 3.5–5.2)
Sodium: 140 mmol/L (ref 134–144)

## 2017-07-20 ENCOUNTER — Telehealth: Payer: Self-pay | Admitting: Cardiology

## 2017-07-20 NOTE — Telephone Encounter (Signed)
New Message   Pt c/o BP issue:  1. What are your last 5 BP readings? 162/114, 158/106, 146/97, 130/94 2. Are you having any other symptoms (ex. Dizziness, headache, blurred vision, passed out)? Headache this morning but none over the weekend 3. What is your medication issue? no

## 2017-07-20 NOTE — Telephone Encounter (Signed)
Returned the call to the patient. He stated that he has had some elevated blood pressures in the morning and late at night. In the mornings it sometimes run around 169/114 but a few hours after his medication his blood pressure decreases to 120/80 and 117/75. By night, his blood pressure goes up again.  He currently takes: Carvedilol 12.5 bid Clonidine 0.1 mg tid Losartan 100 mg (in the morning)  The patient occasionally gets a headache otherwise he is asymptomatic. He has been advised to stop taking his blood pressure so many times throughout the day because this could be causing it to increase. He was currently taking it 6-9 times a day. Message has been routed to the provider for his information and any other recommendation.

## 2017-07-21 NOTE — Telephone Encounter (Signed)
Returned the call to the patient. He will try taking the Losartan at night and call us back with an update.

## 2017-07-21 NOTE — Telephone Encounter (Signed)
He could also try taking his Losartan at night.   Thanks,  Kerin Ransom PA-C 07/21/2017 11:19 AM

## 2017-07-31 LAB — CUP PACEART REMOTE DEVICE CHECK
Battery Remaining Longevity: 137 mo
Battery Voltage: 2.78 V
Brady Statistic AP VP Percent: 0 %
Brady Statistic AP VS Percent: 1 %
Date Time Interrogation Session: 20190411152807
Implantable Lead Implant Date: 20050503
Implantable Lead Location: 753859
Implantable Lead Location: 753860
Implantable Pulse Generator Implant Date: 20150922
Lead Channel Impedance Value: 650 Ohm
Lead Channel Setting Pacing Amplitude: 5 V
Lead Channel Setting Sensing Sensitivity: 2.8 mV
MDC IDC LEAD IMPLANT DT: 20050503
MDC IDC MSMT BATTERY IMPEDANCE: 161 Ohm
MDC IDC MSMT LEADCHNL RA IMPEDANCE VALUE: 563 Ohm
MDC IDC MSMT LEADCHNL RA PACING THRESHOLD AMPLITUDE: 0.5 V
MDC IDC MSMT LEADCHNL RA PACING THRESHOLD PULSEWIDTH: 0.4 ms
MDC IDC SET LEADCHNL RA PACING AMPLITUDE: 1.5 V
MDC IDC SET LEADCHNL RV PACING PULSEWIDTH: 1 ms
MDC IDC STAT BRADY AS VP PERCENT: 0 %
MDC IDC STAT BRADY AS VS PERCENT: 98 %

## 2017-08-14 ENCOUNTER — Ambulatory Visit: Payer: 59 | Admitting: Cardiology

## 2017-09-21 ENCOUNTER — Encounter: Payer: 59 | Admitting: Cardiovascular Disease

## 2017-09-21 DIAGNOSIS — C642 Malignant neoplasm of left kidney, except renal pelvis: Secondary | ICD-10-CM | POA: Diagnosis not present

## 2017-09-24 ENCOUNTER — Telehealth: Payer: Self-pay | Admitting: Cardiology

## 2017-09-24 ENCOUNTER — Ambulatory Visit (INDEPENDENT_AMBULATORY_CARE_PROVIDER_SITE_OTHER): Payer: Medicare Other | Admitting: *Deleted

## 2017-09-24 DIAGNOSIS — I495 Sick sinus syndrome: Secondary | ICD-10-CM | POA: Diagnosis not present

## 2017-09-24 NOTE — Telephone Encounter (Signed)
Spoke with pt and reminded pt of remote transmission that is due today. Pt verbalized understanding.   

## 2017-09-24 NOTE — Progress Notes (Signed)
Remote pacemaker transmission.   

## 2017-09-25 DIAGNOSIS — C649 Malignant neoplasm of unspecified kidney, except renal pelvis: Secondary | ICD-10-CM | POA: Diagnosis not present

## 2017-09-25 DIAGNOSIS — N281 Cyst of kidney, acquired: Secondary | ICD-10-CM | POA: Diagnosis not present

## 2017-09-25 DIAGNOSIS — C642 Malignant neoplasm of left kidney, except renal pelvis: Secondary | ICD-10-CM | POA: Diagnosis not present

## 2017-09-28 DIAGNOSIS — C642 Malignant neoplasm of left kidney, except renal pelvis: Secondary | ICD-10-CM | POA: Diagnosis not present

## 2017-10-25 LAB — CUP PACEART REMOTE DEVICE CHECK
Battery Impedance: 185 Ohm
Battery Remaining Longevity: 131 mo
Battery Voltage: 2.78 V
Brady Statistic AS VS Percent: 99 %
Implantable Lead Implant Date: 20050503
Implantable Lead Implant Date: 20050503
Implantable Lead Location: 753859
Implantable Lead Model: 5092
Lead Channel Impedance Value: 593 Ohm
Lead Channel Pacing Threshold Amplitude: 0.5 V
Lead Channel Setting Pacing Amplitude: 1.5 V
Lead Channel Setting Sensing Sensitivity: 2.8 mV
MDC IDC LEAD LOCATION: 753860
MDC IDC MSMT LEADCHNL RA IMPEDANCE VALUE: 572 Ohm
MDC IDC MSMT LEADCHNL RA PACING THRESHOLD PULSEWIDTH: 0.4 ms
MDC IDC PG IMPLANT DT: 20150922
MDC IDC SESS DTM: 20190711181331
MDC IDC SET LEADCHNL RV PACING AMPLITUDE: 5 V
MDC IDC SET LEADCHNL RV PACING PULSEWIDTH: 1 ms
MDC IDC STAT BRADY AP VP PERCENT: 0 %
MDC IDC STAT BRADY AP VS PERCENT: 1 %
MDC IDC STAT BRADY AS VP PERCENT: 0 %

## 2017-12-22 DIAGNOSIS — G4733 Obstructive sleep apnea (adult) (pediatric): Secondary | ICD-10-CM | POA: Diagnosis not present

## 2017-12-22 DIAGNOSIS — I10 Essential (primary) hypertension: Secondary | ICD-10-CM | POA: Diagnosis not present

## 2017-12-22 DIAGNOSIS — M5441 Lumbago with sciatica, right side: Secondary | ICD-10-CM | POA: Diagnosis not present

## 2017-12-22 DIAGNOSIS — R05 Cough: Secondary | ICD-10-CM | POA: Diagnosis not present

## 2017-12-24 ENCOUNTER — Ambulatory Visit (INDEPENDENT_AMBULATORY_CARE_PROVIDER_SITE_OTHER): Payer: Medicare Other | Admitting: *Deleted

## 2017-12-24 DIAGNOSIS — I495 Sick sinus syndrome: Secondary | ICD-10-CM

## 2017-12-24 NOTE — Progress Notes (Signed)
Remote pacemaker transmission.   

## 2017-12-31 ENCOUNTER — Encounter: Payer: Self-pay | Admitting: Cardiology

## 2018-01-15 DIAGNOSIS — D23112 Other benign neoplasm of skin of right lower eyelid, including canthus: Secondary | ICD-10-CM | POA: Diagnosis not present

## 2018-01-15 DIAGNOSIS — D23111 Other benign neoplasm of skin of right upper eyelid, including canthus: Secondary | ICD-10-CM | POA: Diagnosis not present

## 2018-01-15 DIAGNOSIS — D23122 Other benign neoplasm of skin of left lower eyelid, including canthus: Secondary | ICD-10-CM | POA: Diagnosis not present

## 2018-01-21 DIAGNOSIS — M25512 Pain in left shoulder: Secondary | ICD-10-CM | POA: Diagnosis not present

## 2018-02-02 LAB — CUP PACEART REMOTE DEVICE CHECK
Battery Impedance: 185 Ohm
Battery Remaining Longevity: 130 mo
Brady Statistic AP VP Percent: 0 %
Brady Statistic AP VS Percent: 0 %
Brady Statistic AS VS Percent: 99 %
Date Time Interrogation Session: 20191010124140
Implantable Lead Implant Date: 20050503
Implantable Lead Location: 753860
Implantable Lead Model: 5092
Implantable Lead Model: 5594
Implantable Pulse Generator Implant Date: 20150922
Lead Channel Impedance Value: 521 Ohm
Lead Channel Pacing Threshold Amplitude: 0.5 V
Lead Channel Setting Pacing Amplitude: 1.5 V
Lead Channel Setting Pacing Pulse Width: 1 ms
Lead Channel Setting Sensing Sensitivity: 2 mV
MDC IDC LEAD IMPLANT DT: 20050503
MDC IDC LEAD LOCATION: 753859
MDC IDC MSMT BATTERY VOLTAGE: 2.79 V
MDC IDC MSMT LEADCHNL RA PACING THRESHOLD PULSEWIDTH: 0.4 ms
MDC IDC MSMT LEADCHNL RV IMPEDANCE VALUE: 554 Ohm
MDC IDC SET LEADCHNL RV PACING AMPLITUDE: 5 V
MDC IDC STAT BRADY AS VP PERCENT: 0 %

## 2018-02-26 ENCOUNTER — Other Ambulatory Visit: Payer: Self-pay | Admitting: Internal Medicine

## 2018-02-26 DIAGNOSIS — R1011 Right upper quadrant pain: Secondary | ICD-10-CM

## 2018-03-02 ENCOUNTER — Ambulatory Visit
Admission: RE | Admit: 2018-03-02 | Discharge: 2018-03-02 | Disposition: A | Discharge status: Home / Self Care | Payer: Medicare Other | Source: Ambulatory Visit | Attending: Internal Medicine | Admitting: Internal Medicine

## 2018-03-02 DIAGNOSIS — R1011 Right upper quadrant pain: Secondary | ICD-10-CM

## 2018-03-02 DIAGNOSIS — K7689 Other specified diseases of liver: Secondary | ICD-10-CM | POA: Diagnosis not present

## 2018-03-26 ENCOUNTER — Encounter: Payer: Self-pay | Admitting: Cardiology

## 2018-03-26 ENCOUNTER — Telehealth: Payer: Self-pay

## 2018-03-26 NOTE — Telephone Encounter (Signed)
Pt called Medtronic tech support. They stated that they will be sending him a new monitor in 10 business days.

## 2018-03-29 ENCOUNTER — Telehealth: Payer: Self-pay

## 2018-03-29 NOTE — Telephone Encounter (Signed)
Spoke with patient to remind of missed remote transmission. Waiting on new monitor. New appt made 1/24

## 2018-04-01 ENCOUNTER — Ambulatory Visit (INDEPENDENT_AMBULATORY_CARE_PROVIDER_SITE_OTHER): Payer: Medicare Other

## 2018-04-01 DIAGNOSIS — R55 Syncope and collapse: Secondary | ICD-10-CM

## 2018-04-01 DIAGNOSIS — I495 Sick sinus syndrome: Secondary | ICD-10-CM | POA: Diagnosis not present

## 2018-04-02 NOTE — Progress Notes (Signed)
Remote pacemaker transmission.   

## 2018-04-03 LAB — CUP PACEART REMOTE DEVICE CHECK
Battery Remaining Longevity: 130 mo
Brady Statistic AP VP Percent: 0 %
Date Time Interrogation Session: 20200116141444
Implantable Lead Implant Date: 20050503
Implantable Lead Location: 753859
Implantable Pulse Generator Implant Date: 20150922
Lead Channel Pacing Threshold Pulse Width: 0.4 ms
Lead Channel Setting Pacing Amplitude: 1.5 V
Lead Channel Setting Pacing Pulse Width: 1 ms
MDC IDC LEAD IMPLANT DT: 20050503
MDC IDC LEAD LOCATION: 753860
MDC IDC MSMT BATTERY IMPEDANCE: 185 Ohm
MDC IDC MSMT BATTERY VOLTAGE: 2.79 V
MDC IDC MSMT LEADCHNL RA IMPEDANCE VALUE: 538 Ohm
MDC IDC MSMT LEADCHNL RA PACING THRESHOLD AMPLITUDE: 0.625 V
MDC IDC MSMT LEADCHNL RV IMPEDANCE VALUE: 556 Ohm
MDC IDC SET LEADCHNL RV PACING AMPLITUDE: 5 V
MDC IDC SET LEADCHNL RV SENSING SENSITIVITY: 2.8 mV
MDC IDC STAT BRADY AP VS PERCENT: 0 %
MDC IDC STAT BRADY AS VP PERCENT: 0 %
MDC IDC STAT BRADY AS VS PERCENT: 100 %

## 2018-04-16 LAB — CUP PACEART INCLINIC DEVICE CHECK
Date Time Interrogation Session: 20200131112140
Implantable Lead Implant Date: 20050503
Implantable Lead Implant Date: 20050503
Implantable Lead Location: 753860
MDC IDC LEAD LOCATION: 753859
MDC IDC PG IMPLANT DT: 20150922

## 2018-07-01 ENCOUNTER — Ambulatory Visit (INDEPENDENT_AMBULATORY_CARE_PROVIDER_SITE_OTHER): Payer: Medicare Other | Admitting: *Deleted

## 2018-07-01 ENCOUNTER — Other Ambulatory Visit: Payer: Self-pay

## 2018-07-01 DIAGNOSIS — I495 Sick sinus syndrome: Secondary | ICD-10-CM | POA: Diagnosis not present

## 2018-07-01 LAB — CUP PACEART REMOTE DEVICE CHECK
Battery Impedance: 209 Ohm
Battery Remaining Longevity: 126 mo
Battery Voltage: 2.79 V
Brady Statistic AP VP Percent: 0 %
Brady Statistic AP VS Percent: 0 %
Brady Statistic AS VP Percent: 0 %
Brady Statistic AS VS Percent: 100 %
Date Time Interrogation Session: 20200416114954
Implantable Lead Implant Date: 20050503
Implantable Lead Implant Date: 20050503
Implantable Lead Location: 753859
Implantable Lead Location: 753860
Implantable Lead Model: 5092
Implantable Lead Model: 5594
Implantable Pulse Generator Implant Date: 20150922
Lead Channel Impedance Value: 537 Ohm
Lead Channel Impedance Value: 568 Ohm
Lead Channel Pacing Threshold Amplitude: 0.5 V
Lead Channel Pacing Threshold Pulse Width: 0.4 ms
Lead Channel Setting Pacing Amplitude: 1.5 V
Lead Channel Setting Pacing Amplitude: 5 V
Lead Channel Setting Pacing Pulse Width: 1 ms
Lead Channel Setting Sensing Sensitivity: 2.8 mV

## 2018-07-07 ENCOUNTER — Encounter: Payer: Self-pay | Admitting: Cardiology

## 2018-07-07 NOTE — Progress Notes (Signed)
Remote pacemaker transmission.   

## 2018-09-29 DIAGNOSIS — C642 Malignant neoplasm of left kidney, except renal pelvis: Secondary | ICD-10-CM | POA: Diagnosis not present

## 2018-09-30 ENCOUNTER — Ambulatory Visit (INDEPENDENT_AMBULATORY_CARE_PROVIDER_SITE_OTHER): Payer: Medicare Other | Admitting: *Deleted

## 2018-09-30 DIAGNOSIS — I495 Sick sinus syndrome: Secondary | ICD-10-CM

## 2018-10-01 ENCOUNTER — Telehealth: Payer: Self-pay

## 2018-10-01 LAB — CUP PACEART REMOTE DEVICE CHECK
Battery Impedance: 185 Ohm
Battery Remaining Longevity: 131 mo
Battery Voltage: 2.78 V
Brady Statistic AP VP Percent: 0 %
Brady Statistic AP VS Percent: 0 %
Brady Statistic AS VP Percent: 0 %
Brady Statistic AS VS Percent: 100 %
Date Time Interrogation Session: 20200717162116
Implantable Lead Implant Date: 20050503
Implantable Lead Implant Date: 20050503
Implantable Lead Location: 753859
Implantable Lead Location: 753860
Implantable Lead Model: 5092
Implantable Lead Model: 5594
Implantable Pulse Generator Implant Date: 20150922
Lead Channel Impedance Value: 555 Ohm
Lead Channel Impedance Value: 593 Ohm
Lead Channel Pacing Threshold Amplitude: 0.5 V
Lead Channel Pacing Threshold Pulse Width: 0.4 ms
Lead Channel Setting Pacing Amplitude: 1.5 V
Lead Channel Setting Pacing Amplitude: 5 V
Lead Channel Setting Pacing Pulse Width: 1 ms
Lead Channel Setting Sensing Sensitivity: 2.8 mV

## 2018-10-01 NOTE — Telephone Encounter (Signed)
Spoke with patient to remind of missed remote transmission 

## 2018-10-04 DIAGNOSIS — Z85528 Personal history of other malignant neoplasm of kidney: Secondary | ICD-10-CM | POA: Diagnosis not present

## 2018-10-06 ENCOUNTER — Ambulatory Visit (HOSPITAL_COMMUNITY)
Admission: RE | Admit: 2018-10-06 | Discharge: 2018-10-06 | Disposition: A | Discharge status: Home / Self Care | Payer: Medicare Other | Source: Ambulatory Visit | Attending: Urology | Admitting: Urology

## 2018-10-06 ENCOUNTER — Other Ambulatory Visit (HOSPITAL_COMMUNITY): Payer: Self-pay | Admitting: Urology

## 2018-10-06 ENCOUNTER — Other Ambulatory Visit: Payer: Self-pay

## 2018-10-06 DIAGNOSIS — N281 Cyst of kidney, acquired: Secondary | ICD-10-CM | POA: Diagnosis not present

## 2018-10-06 DIAGNOSIS — C641 Malignant neoplasm of right kidney, except renal pelvis: Secondary | ICD-10-CM | POA: Diagnosis not present

## 2018-10-06 DIAGNOSIS — C642 Malignant neoplasm of left kidney, except renal pelvis: Secondary | ICD-10-CM | POA: Diagnosis not present

## 2018-10-06 DIAGNOSIS — K5730 Diverticulosis of large intestine without perforation or abscess without bleeding: Secondary | ICD-10-CM | POA: Diagnosis not present

## 2018-10-06 DIAGNOSIS — Z85528 Personal history of other malignant neoplasm of kidney: Secondary | ICD-10-CM | POA: Diagnosis not present

## 2018-10-07 ENCOUNTER — Encounter: Payer: Self-pay | Admitting: Cardiology

## 2018-10-07 NOTE — Progress Notes (Signed)
Remote pacemaker transmission.   

## 2018-10-14 DIAGNOSIS — C642 Malignant neoplasm of left kidney, except renal pelvis: Secondary | ICD-10-CM | POA: Diagnosis not present

## 2018-11-08 DIAGNOSIS — I10 Essential (primary) hypertension: Secondary | ICD-10-CM | POA: Diagnosis not present

## 2018-11-08 DIAGNOSIS — Z1389 Encounter for screening for other disorder: Secondary | ICD-10-CM | POA: Diagnosis not present

## 2018-11-08 DIAGNOSIS — K219 Gastro-esophageal reflux disease without esophagitis: Secondary | ICD-10-CM | POA: Diagnosis not present

## 2018-11-08 DIAGNOSIS — Z23 Encounter for immunization: Secondary | ICD-10-CM | POA: Diagnosis not present

## 2018-11-08 DIAGNOSIS — Z0000 Encounter for general adult medical examination without abnormal findings: Secondary | ICD-10-CM | POA: Diagnosis not present

## 2018-11-08 DIAGNOSIS — G4733 Obstructive sleep apnea (adult) (pediatric): Secondary | ICD-10-CM | POA: Diagnosis not present

## 2018-11-08 DIAGNOSIS — E782 Mixed hyperlipidemia: Secondary | ICD-10-CM | POA: Diagnosis not present

## 2018-11-08 DIAGNOSIS — K760 Fatty (change of) liver, not elsewhere classified: Secondary | ICD-10-CM | POA: Diagnosis not present

## 2018-11-08 DIAGNOSIS — Z1211 Encounter for screening for malignant neoplasm of colon: Secondary | ICD-10-CM | POA: Diagnosis not present

## 2018-11-08 DIAGNOSIS — Z125 Encounter for screening for malignant neoplasm of prostate: Secondary | ICD-10-CM | POA: Diagnosis not present

## 2018-12-30 ENCOUNTER — Ambulatory Visit (INDEPENDENT_AMBULATORY_CARE_PROVIDER_SITE_OTHER): Payer: Medicare Other | Admitting: *Deleted

## 2018-12-30 DIAGNOSIS — R55 Syncope and collapse: Secondary | ICD-10-CM

## 2018-12-30 LAB — CUP PACEART REMOTE DEVICE CHECK
Battery Impedance: 209 Ohm
Battery Remaining Longevity: 126 mo
Battery Voltage: 2.78 V
Brady Statistic AP VP Percent: 0 %
Brady Statistic AP VS Percent: 0 %
Brady Statistic AS VP Percent: 0 %
Brady Statistic AS VS Percent: 100 %
Date Time Interrogation Session: 20201015115040
Implantable Lead Implant Date: 20050503
Implantable Lead Implant Date: 20050503
Implantable Lead Location: 753859
Implantable Lead Location: 753860
Implantable Lead Model: 5092
Implantable Lead Model: 5594
Implantable Pulse Generator Implant Date: 20150922
Lead Channel Impedance Value: 498 Ohm
Lead Channel Impedance Value: 545 Ohm
Lead Channel Pacing Threshold Amplitude: 0.5 V
Lead Channel Pacing Threshold Pulse Width: 0.4 ms
Lead Channel Setting Pacing Amplitude: 1.5 V
Lead Channel Setting Pacing Amplitude: 5 V
Lead Channel Setting Pacing Pulse Width: 1 ms
Lead Channel Setting Sensing Sensitivity: 2 mV

## 2019-01-13 NOTE — Progress Notes (Signed)
Remote pacemaker transmission.   

## 2019-03-31 ENCOUNTER — Ambulatory Visit (INDEPENDENT_AMBULATORY_CARE_PROVIDER_SITE_OTHER): Payer: Medicare Other | Admitting: *Deleted

## 2019-03-31 DIAGNOSIS — Z950 Presence of cardiac pacemaker: Secondary | ICD-10-CM | POA: Diagnosis not present

## 2019-03-31 LAB — CUP PACEART REMOTE DEVICE CHECK
Battery Impedance: 209 Ohm
Battery Remaining Longevity: 126 mo
Battery Voltage: 2.79 V
Brady Statistic AP VP Percent: 0 %
Brady Statistic AP VS Percent: 0 %
Brady Statistic AS VP Percent: 0 %
Brady Statistic AS VS Percent: 100 %
Date Time Interrogation Session: 20210114080210
Implantable Lead Implant Date: 20050503
Implantable Lead Implant Date: 20050503
Implantable Lead Location: 753859
Implantable Lead Location: 753860
Implantable Lead Model: 5092
Implantable Lead Model: 5594
Implantable Pulse Generator Implant Date: 20150922
Lead Channel Impedance Value: 538 Ohm
Lead Channel Impedance Value: 545 Ohm
Lead Channel Pacing Threshold Amplitude: 0.5 V
Lead Channel Pacing Threshold Pulse Width: 0.4 ms
Lead Channel Setting Pacing Amplitude: 1.5 V
Lead Channel Setting Pacing Amplitude: 5 V
Lead Channel Setting Pacing Pulse Width: 1 ms
Lead Channel Setting Sensing Sensitivity: 2 mV

## 2019-04-01 NOTE — Progress Notes (Signed)
PPM remote 

## 2019-06-30 ENCOUNTER — Ambulatory Visit (INDEPENDENT_AMBULATORY_CARE_PROVIDER_SITE_OTHER): Payer: Medicare Other | Admitting: *Deleted

## 2019-06-30 DIAGNOSIS — Z950 Presence of cardiac pacemaker: Secondary | ICD-10-CM | POA: Diagnosis not present

## 2019-07-01 LAB — CUP PACEART REMOTE DEVICE CHECK
Battery Impedance: 234 Ohm
Battery Remaining Longevity: 122 mo
Battery Voltage: 2.78 V
Brady Statistic AP VP Percent: 0 %
Brady Statistic AP VS Percent: 0 %
Brady Statistic AS VP Percent: 0 %
Brady Statistic AS VS Percent: 100 %
Date Time Interrogation Session: 20210416131825
Implantable Lead Implant Date: 20050503
Implantable Lead Implant Date: 20050503
Implantable Lead Location: 753859
Implantable Lead Location: 753860
Implantable Lead Model: 5092
Implantable Lead Model: 5594
Implantable Pulse Generator Implant Date: 20150922
Lead Channel Impedance Value: 545 Ohm
Lead Channel Impedance Value: 575 Ohm
Lead Channel Pacing Threshold Amplitude: 0.5 V
Lead Channel Pacing Threshold Pulse Width: 0.4 ms
Lead Channel Setting Pacing Amplitude: 1.5 V
Lead Channel Setting Pacing Amplitude: 5 V
Lead Channel Setting Pacing Pulse Width: 1 ms
Lead Channel Setting Sensing Sensitivity: 2.8 mV

## 2019-07-25 ENCOUNTER — Emergency Department (HOSPITAL_COMMUNITY)
Admission: EM | Admit: 2019-07-25 | Discharge: 2019-07-25 | Disposition: A | Discharge status: Home / Self Care | Payer: Medicare Other | Attending: Emergency Medicine | Admitting: Emergency Medicine

## 2019-07-25 ENCOUNTER — Other Ambulatory Visit: Payer: Self-pay

## 2019-07-25 ENCOUNTER — Encounter (HOSPITAL_COMMUNITY): Payer: Self-pay | Admitting: *Deleted

## 2019-07-25 DIAGNOSIS — W57XXXA Bitten or stung by nonvenomous insect and other nonvenomous arthropods, initial encounter: Secondary | ICD-10-CM | POA: Diagnosis not present

## 2019-07-25 DIAGNOSIS — Y929 Unspecified place or not applicable: Secondary | ICD-10-CM | POA: Diagnosis not present

## 2019-07-25 DIAGNOSIS — Y939 Activity, unspecified: Secondary | ICD-10-CM | POA: Diagnosis not present

## 2019-07-25 DIAGNOSIS — I10 Essential (primary) hypertension: Secondary | ICD-10-CM | POA: Diagnosis not present

## 2019-07-25 DIAGNOSIS — Z950 Presence of cardiac pacemaker: Secondary | ICD-10-CM | POA: Diagnosis not present

## 2019-07-25 DIAGNOSIS — S70362A Insect bite (nonvenomous), left thigh, initial encounter: Secondary | ICD-10-CM | POA: Insufficient documentation

## 2019-07-25 DIAGNOSIS — Y999 Unspecified external cause status: Secondary | ICD-10-CM | POA: Insufficient documentation

## 2019-07-25 DIAGNOSIS — Z79899 Other long term (current) drug therapy: Secondary | ICD-10-CM | POA: Insufficient documentation

## 2019-07-25 MED ORDER — DOXYCYCLINE HYCLATE 100 MG PO CAPS: 100.0000 mg | ORAL_CAPSULE | Freq: Two times a day (BID) | ORAL | 0 refills | Status: AC

## 2019-07-25 NOTE — ED Provider Notes (Signed)
[facility] EMERGENCY DEPARTMENT Provider Note   CSN: [id number] Arrival date & time: 07/25/19  2119     History Chief Complaint  Patient presents with  . Insect Bite    Brent Stuart is a 69 y.o. male with a history of hypertension, GERD, sick sinus syndrome with pacemaker in place, and sleep apnea who presents to the ED with complaints of tick bite. Patient states he noted some discomfort/irritation to the L inner thigh this AM, inspected the area and found a tick. He applied alcohol to the area and was able to fully remove the tick. States since then he had progressively worsening redness to the site.  No alleviating or aggravating factors.  Otherwise feels at baseline.  Denies fever, chills, nausea, vomiting, arthralgias, myalgias, or other areas of rashes.  He is unsure how long the tick had been there.  HPI     Past Medical History:  Diagnosis Date  . AC (acromioclavicular) joint bone spurs    On feet  . Anxiety   . Complication of anesthesia    SHAKING AFTER WAKING UP FROM SURGERY   . Exogenous obesity    mild  . GERD (gastroesophageal reflux disease)   . Hypertension    mild  . Impotence    mild  . Left knee pain   . Neurocardiogenic syncope    Hx of chronic neurocardiogenic syncope and he has had a negative tilt-table in the past, but significant bradycardia with prolonged asystole ans syncope, prompting his pacemaker implant in 2005.  . Orthostatic lightheadedness    Occasional. Only one episode in 08/2012 when he was not in [employer] of presyncope that was transient.  . OSA on CPAP    SETTINGS AT 8.5   . Pacemaker   . Plantar fasciitis   . RBBB   . Sick sinus syndrome ([place])    Significant bradycardia with prolonged asystole ans syncope, prompting his pacemaker implant in 2005.  . Truncal obesity     Patient Active Problem List   Diagnosis Date Noted  . Pacemaker 01/17/2014  . Pacemaker at end of battery life 12/06/2013  . Renal mass  10/19/2013  . Neurocardiogenic syncope 05/09/2013  . Uncontrolled hypertension 05/09/2013  . Overweight (BMI 25.0-29.9) 05/09/2013  . Sleep apnea 05/09/2013    Past Surgical History:  Procedure Laterality Date  . APPENDECTOMY  1994  . BACK SURGERY  10/21/2006   Foraminotomy  . BUNIONECTOMY Right    First MTP area  . COLON SURGERY  1994   By Dr. Leafy Kindle for nonmalignant disease with associated appendectomy  . DECOMPRESSIVE LUMBAR LAMINECTOMY LEVEL 3    . DECOMPRESSIVE LUMBAR LAMINECTOMY LEVEL 4    . MICRODISCECTOMY LUMBAR    . PACEMAKER INSERTION  07/18/03   Medtronic Enpulse [id number] implanted by Dr Rollene Fare  . PERMANENT PACEMAKER GENERATOR CHANGE N/A 12/06/2013   Procedure: PERMANENT PACEMAKER GENERATOR CHANGE;  Surgeon: Sanda Klein, MD;  Location: [facility]ATH [facility];  Service: Cardiovascular;  Laterality: N/A;  . ROBOTIC ASSITED PARTIAL NEPHRECTOMY Left 10/19/2013   Procedure: ROBOTIC ASSISTED LAPAROSCOPIC LEFT PARTIAL NEPHRECTOMY, EXSTENSIVE LAPAROSCOPIC LYSIS OF ADHESIONS;  Surgeon: Ardis Hughs, MD;  Location: [facility] OR[facility];  Service: Urology;  Laterality: Left;  . SEPTOPLASTY         Family History  Problem Relation Age of Onset  . Hypertension Other     Social History   Tobacco Use  . Smoking status: Never Smoker  . Smokeless tobacco: Never Used  Substance Use  Topics  . Alcohol use: No  . Drug use: No    Home Medications Prior to Admission medications   Medication Sig Start Date End Date Taking? Authorizing Provider  acetaminophen (TYLENOL) 500 MG tablet Take 1,000 mg by mouth every 6 (six) hours as needed (pain). Reported on 06/08/2015    [provider]  ALPRAZolam Duanne Moron) 0.25 MG tablet Take 0.125 mg by mouth 2 (two) times daily as needed for anxiety.     [provider]  aspirin-acetaminophen-caffeine (EXCEDRIN MIGRAINE) [phone] MG tablet Take by mouth as needed for headache or migraine.    [provider]  carvedilol (COREG) 12.5 MG tablet  Take 1 tablet (12.5 mg total) by mouth 2 (two) times daily. 07/15/17 02/10/18  Erlene Quan, PA-C  CIALIS 20 MG tablet Take 1 tablet by mouth as needed. Use as directed take 1 tab by mouth as needed 04/27/15   [provider]  cloNIDine (CATAPRES) 0.1 MG tablet Take 1 tablet (0.1 mg total) by mouth 3 (three) times daily. 07/15/17   Erlene Quan, PA-C  cyclobenzaprine (FLEXERIL) 10 MG tablet Take 10 mg by mouth 3 (three) times daily as needed for muscle spasms.    [provider]  hydroxypropyl methylcellulose / hypromellose (ISOPTO TEARS / GONIOVISC) 2.5 % ophthalmic solution Place 1 drop into both eyes daily as needed for dry eyes. Reported on 06/08/2015    [provider]  ibuprofen (ADVIL,MOTRIN) 200 MG tablet Take 400-600 mg by mouth every 6 (six) hours as needed (pain).     [provider]  losartan (COZAAR) 100 MG tablet Take 1 tablet (100 mg total) by mouth daily. 07/15/17 02/10/18  Erlene Quan, PA-C  metoprolol tartrate (LOPRESSOR) 25 MG tablet Take 1 tablet (25 mg total) by mouth 2 (two) times daily. KEEP OV. 06/01/17   Croitoru, Mihai, MD  omeprazole (PRILOSEC) 20 MG capsule Take 20 mg by mouth daily.    [provider]    Allergies    Penicillins, Septra [sulfamethoxazole-trimethoprim], and Zoloft [sertraline hcl]  Review of Systems   Review of Systems  Constitutional: Negative for chills, fatigue and fever.  Respiratory: Negative for shortness of breath.   Cardiovascular: Negative for chest pain.  Gastrointestinal: Negative for nausea and vomiting.  Musculoskeletal: Negative for arthralgias and myalgias.  Skin: Positive for color change and wound.  Neurological: Negative for syncope.  All other systems reviewed and are negative.   Physical Exam Updated Vital Signs BP (!) 165/98 (BP Location: Left Arm)   Pulse 87   Temp 98.8 F (37.1 C) (Oral)   Resp 16   SpO2 98%   Physical Exam Vitals and nursing note reviewed.    Constitutional:      General: He is not in acute distress.    Appearance: He is well-developed. He is not toxic-appearing.  HENT:     Head: Normocephalic and atraumatic.  Eyes:     General:        Right eye: No discharge.        Left eye: No discharge.     Conjunctiva/sclera: Conjunctivae normal.  Cardiovascular:     Rate and Rhythm: Normal rate and regular rhythm.  Pulmonary:     Effort: Pulmonary effort is normal. No respiratory distress.     Breath sounds: Normal breath sounds. No wheezing, rhonchi or rales.  Abdominal:     General: There is no distension.     Palpations: Abdomen is soft.     Tenderness: There  is no abdominal tenderness.  Musculoskeletal:     Cervical back: Neck supple.  Skin:    General: Skin is warm and dry.       Neurological:     Mental Status: He is alert.     Comments: Clear speech.   Psychiatric:        Behavior: Behavior normal.     ED Results / Procedures / Treatments   Labs (all labs ordered are listed, but only abnormal results are displayed) Labs Reviewed - No data to display  EKG None  Radiology No results found.  Procedures Procedures (including critical care time)  Medications Ordered in ED Medications - No data to display  ED Course  I have reviewed the triage vital signs and the nursing notes.  Pertinent labs & imaging results that were available during my care of the patient were reviewed by me and considered in my medical decision making (see chart for details).    MDM Rules/Calculators/A&P                     Patient presents to the emergency department with area of erythema to the right inner thigh status post tick removal this morning.  Patient is nontoxic, resting comfortably, vitals WNL with the exception of his elevated blood pressure, low suspicion for hypertensive emergency.  He does not appear systemically ill.  Other than this area of erythema he is otherwise asymptomatic.  Exam not consistent with abscess.   No palm/sole rash.  Given the area of erythema is expanding will cover for tickborne illness as well as possible early cellulitis with doxycycline.  Last tetanus was within the past 10 years.  I discussed treatment plan, need for follow-up, and return precautions with the patient. Provided opportunity for questions, patient confirmed understanding and is in agreement with plan.   Final Clinical Impression(s) / ED Diagnoses Final diagnoses:  Tick bite, initial encounter    Rx / DC Orders ED Discharge Orders         Ordered    doxycycline (VIBRAMYCIN) 100 MG capsule  2 times daily     07/25/19 2333           Stevee Valenta, Glynda Jaeger, PA-C 07/25/19 2334    Ripley Fraise, MD 07/26/19 [phone]

## 2019-07-25 NOTE — ED Triage Notes (Signed)
Pt pulled a tick off his L upper thigh this morning. Now has a quarter-size red area noted around tick bite. Denies fever or chills. Pt reports the redness continues to spread.

## 2019-07-25 NOTE — Discharge Instructions (Addendum)
You were seen in the emergency department today following a tick bite.  You are sending you with doxycycline, an antibiotic, to cover for tickborne illnesses as well as possibly a skin infection.  Please take this as prescribed.  We have prescribed you new medication(s) today. Discuss the medications prescribed today with your pharmacist as they can have adverse effects and interactions with your other medicines including over the counter and prescribed medications. Seek medical evaluation if you start to experience new or abnormal symptoms after taking one of these medicines, seek care immediately if you start to experience difficulty breathing, feeling of your throat closing, facial swelling, or rash as these could be indications of a more serious allergic reaction  Please follow-up with your primary care provider for recheck of the area within 1 week.  Return to the ER for new or worsening symptoms including but not limited to fever, spreading redness, new rashes, joint pain, or any other concerns.

## 2019-07-29 DIAGNOSIS — S70361A Insect bite (nonvenomous), right thigh, initial encounter: Secondary | ICD-10-CM | POA: Diagnosis not present

## 2019-09-29 ENCOUNTER — Ambulatory Visit (INDEPENDENT_AMBULATORY_CARE_PROVIDER_SITE_OTHER): Payer: Medicare Other | Admitting: *Deleted

## 2019-09-29 DIAGNOSIS — I495 Sick sinus syndrome: Secondary | ICD-10-CM | POA: Diagnosis not present

## 2019-09-29 LAB — CUP PACEART REMOTE DEVICE CHECK
Battery Impedance: 234 Ohm
Battery Remaining Longevity: 122 mo
Battery Voltage: 2.78 V
Brady Statistic AP VP Percent: 0 %
Brady Statistic AP VS Percent: 0 %
Brady Statistic AS VP Percent: 0 %
Brady Statistic AS VS Percent: 100 %
Date Time Interrogation Session: 20210715081143
Implantable Lead Implant Date: 20050503
Implantable Lead Implant Date: 20050503
Implantable Lead Location: 753859
Implantable Lead Location: 753860
Implantable Lead Model: 5092
Implantable Lead Model: 5594
Implantable Pulse Generator Implant Date: 20150922
Lead Channel Impedance Value: 554 Ohm
Lead Channel Impedance Value: 564 Ohm
Lead Channel Pacing Threshold Amplitude: 0.5 V
Lead Channel Pacing Threshold Pulse Width: 0.4 ms
Lead Channel Setting Pacing Amplitude: 1.5 V
Lead Channel Setting Pacing Amplitude: 5 V
Lead Channel Setting Pacing Pulse Width: 1 ms
Lead Channel Setting Sensing Sensitivity: 2 mV

## 2019-09-30 NOTE — Progress Notes (Signed)
Remote pacemaker transmission.   

## 2019-11-11 DIAGNOSIS — I10 Essential (primary) hypertension: Secondary | ICD-10-CM | POA: Diagnosis not present

## 2019-11-11 DIAGNOSIS — Z1211 Encounter for screening for malignant neoplasm of colon: Secondary | ICD-10-CM | POA: Diagnosis not present

## 2019-11-11 DIAGNOSIS — Z1159 Encounter for screening for other viral diseases: Secondary | ICD-10-CM | POA: Diagnosis not present

## 2019-11-11 DIAGNOSIS — E782 Mixed hyperlipidemia: Secondary | ICD-10-CM | POA: Diagnosis not present

## 2019-11-11 DIAGNOSIS — Z0000 Encounter for general adult medical examination without abnormal findings: Secondary | ICD-10-CM | POA: Diagnosis not present

## 2019-11-11 DIAGNOSIS — Z23 Encounter for immunization: Secondary | ICD-10-CM | POA: Diagnosis not present

## 2019-11-11 DIAGNOSIS — Z1389 Encounter for screening for other disorder: Secondary | ICD-10-CM | POA: Diagnosis not present

## 2019-11-11 DIAGNOSIS — G4733 Obstructive sleep apnea (adult) (pediatric): Secondary | ICD-10-CM | POA: Diagnosis not present

## 2019-11-11 DIAGNOSIS — K760 Fatty (change of) liver, not elsewhere classified: Secondary | ICD-10-CM | POA: Diagnosis not present

## 2019-11-11 DIAGNOSIS — K219 Gastro-esophageal reflux disease without esophagitis: Secondary | ICD-10-CM | POA: Diagnosis not present

## 2019-11-14 DIAGNOSIS — Z1211 Encounter for screening for malignant neoplasm of colon: Secondary | ICD-10-CM | POA: Diagnosis not present

## 2019-11-25 DIAGNOSIS — N179 Acute kidney failure, unspecified: Secondary | ICD-10-CM | POA: Diagnosis not present

## 2019-12-01 DIAGNOSIS — G4733 Obstructive sleep apnea (adult) (pediatric): Secondary | ICD-10-CM | POA: Diagnosis not present

## 2019-12-13 DIAGNOSIS — Z23 Encounter for immunization: Secondary | ICD-10-CM | POA: Diagnosis not present

## 2019-12-29 ENCOUNTER — Ambulatory Visit (INDEPENDENT_AMBULATORY_CARE_PROVIDER_SITE_OTHER): Payer: Medicare Other

## 2019-12-29 DIAGNOSIS — I495 Sick sinus syndrome: Secondary | ICD-10-CM | POA: Diagnosis not present

## 2019-12-30 DIAGNOSIS — R748 Abnormal levels of other serum enzymes: Secondary | ICD-10-CM | POA: Diagnosis not present

## 2019-12-31 LAB — CUP PACEART REMOTE DEVICE CHECK
Battery Impedance: 234 Ohm
Battery Remaining Longevity: 121 mo
Battery Voltage: 2.78 V
Brady Statistic AP VP Percent: 0 %
Brady Statistic AP VS Percent: 0 %
Brady Statistic AS VP Percent: 0 %
Brady Statistic AS VS Percent: 100 %
Date Time Interrogation Session: 20211014075950
Implantable Lead Implant Date: 20050503
Implantable Lead Implant Date: 20050503
Implantable Lead Location: 753859
Implantable Lead Location: 753860
Implantable Lead Model: 5092
Implantable Lead Model: 5594
Implantable Pulse Generator Implant Date: 20150922
Lead Channel Impedance Value: 522 Ohm
Lead Channel Impedance Value: 541 Ohm
Lead Channel Pacing Threshold Amplitude: 0.5 V
Lead Channel Pacing Threshold Pulse Width: 0.4 ms
Lead Channel Setting Pacing Amplitude: 1.5 V
Lead Channel Setting Pacing Amplitude: 5 V
Lead Channel Setting Pacing Pulse Width: 1 ms
Lead Channel Setting Sensing Sensitivity: 2 mV

## 2020-01-03 NOTE — Progress Notes (Signed)
Remote pacemaker transmission.   

## 2020-01-06 DIAGNOSIS — G4733 Obstructive sleep apnea (adult) (pediatric): Secondary | ICD-10-CM | POA: Diagnosis not present

## 2020-03-13 DIAGNOSIS — I10 Essential (primary) hypertension: Secondary | ICD-10-CM | POA: Diagnosis not present

## 2020-03-13 DIAGNOSIS — M47812 Spondylosis without myelopathy or radiculopathy, cervical region: Secondary | ICD-10-CM | POA: Diagnosis not present

## 2020-03-13 DIAGNOSIS — E782 Mixed hyperlipidemia: Secondary | ICD-10-CM | POA: Diagnosis not present

## 2020-03-13 DIAGNOSIS — K219 Gastro-esophageal reflux disease without esophagitis: Secondary | ICD-10-CM | POA: Diagnosis not present

## 2020-03-29 ENCOUNTER — Ambulatory Visit (INDEPENDENT_AMBULATORY_CARE_PROVIDER_SITE_OTHER): Payer: Medicare Other

## 2020-03-29 DIAGNOSIS — R55 Syncope and collapse: Secondary | ICD-10-CM | POA: Diagnosis not present

## 2020-04-02 LAB — CUP PACEART REMOTE DEVICE CHECK
Battery Impedance: 258 Ohm
Battery Remaining Longevity: 118 mo
Battery Voltage: 2.77 V
Brady Statistic AP VP Percent: 0 %
Brady Statistic AP VS Percent: 0 %
Brady Statistic AS VP Percent: 0 %
Brady Statistic AS VS Percent: 100 %
Date Time Interrogation Session: 20220114125104
Implantable Lead Implant Date: 20050503
Implantable Lead Implant Date: 20050503
Implantable Lead Location: 753859
Implantable Lead Location: 753860
Implantable Lead Model: 5092
Implantable Lead Model: 5594
Implantable Pulse Generator Implant Date: 20150922
Lead Channel Impedance Value: 592 Ohm
Lead Channel Impedance Value: 612 Ohm
Lead Channel Pacing Threshold Amplitude: 0.5 V
Lead Channel Pacing Threshold Pulse Width: 0.4 ms
Lead Channel Setting Pacing Amplitude: 1.5 V
Lead Channel Setting Pacing Amplitude: 5 V
Lead Channel Setting Pacing Pulse Width: 1 ms
Lead Channel Setting Sensing Sensitivity: 2.8 mV

## 2020-04-11 NOTE — Progress Notes (Signed)
Remote pacemaker transmission.   

## 2020-04-18 DIAGNOSIS — H2513 Age-related nuclear cataract, bilateral: Secondary | ICD-10-CM | POA: Diagnosis not present

## 2020-04-18 DIAGNOSIS — H52203 Unspecified astigmatism, bilateral: Secondary | ICD-10-CM | POA: Diagnosis not present

## 2020-04-18 DIAGNOSIS — H18513 Endothelial corneal dystrophy, bilateral: Secondary | ICD-10-CM | POA: Diagnosis not present

## 2020-06-21 DIAGNOSIS — I10 Essential (primary) hypertension: Secondary | ICD-10-CM | POA: Diagnosis not present

## 2020-06-28 ENCOUNTER — Ambulatory Visit (INDEPENDENT_AMBULATORY_CARE_PROVIDER_SITE_OTHER): Payer: Medicare Other

## 2020-06-28 DIAGNOSIS — I495 Sick sinus syndrome: Secondary | ICD-10-CM | POA: Diagnosis not present

## 2020-07-02 LAB — CUP PACEART REMOTE DEVICE CHECK
Battery Impedance: 258 Ohm
Battery Remaining Longevity: 117 mo
Battery Voltage: 2.78 V
Brady Statistic AP VP Percent: 0 %
Brady Statistic AP VS Percent: 0 %
Brady Statistic AS VP Percent: 0 %
Brady Statistic AS VS Percent: 100 %
Date Time Interrogation Session: 20220414082748
Implantable Lead Implant Date: 20050503
Implantable Lead Implant Date: 20050503
Implantable Lead Location: 753859
Implantable Lead Location: 753860
Implantable Lead Model: 5092
Implantable Lead Model: 5594
Implantable Pulse Generator Implant Date: 20150922
Lead Channel Impedance Value: 519 Ohm
Lead Channel Impedance Value: 525 Ohm
Lead Channel Pacing Threshold Amplitude: 0.625 V
Lead Channel Pacing Threshold Pulse Width: 0.4 ms
Lead Channel Setting Pacing Amplitude: 1.5 V
Lead Channel Setting Pacing Amplitude: 5 V
Lead Channel Setting Pacing Pulse Width: 1 ms
Lead Channel Setting Sensing Sensitivity: 2 mV

## 2020-07-13 NOTE — Progress Notes (Signed)
Remote pacemaker transmission.   

## 2020-09-05 DIAGNOSIS — E782 Mixed hyperlipidemia: Secondary | ICD-10-CM | POA: Diagnosis not present

## 2020-09-05 DIAGNOSIS — I10 Essential (primary) hypertension: Secondary | ICD-10-CM | POA: Diagnosis not present

## 2020-09-05 DIAGNOSIS — M47812 Spondylosis without myelopathy or radiculopathy, cervical region: Secondary | ICD-10-CM | POA: Diagnosis not present

## 2020-09-05 DIAGNOSIS — K219 Gastro-esophageal reflux disease without esophagitis: Secondary | ICD-10-CM | POA: Diagnosis not present

## 2020-09-06 IMAGING — CR CHEST - 2 VIEW
2 series · 2 of 2 positions shown · non-contrast
Comparison: Radiographs May 15, 2015.

CLINICAL DATA: History of right sided renal cell carcinoma.

EXAM:
CHEST - 2 VIEW

[w chest pa]
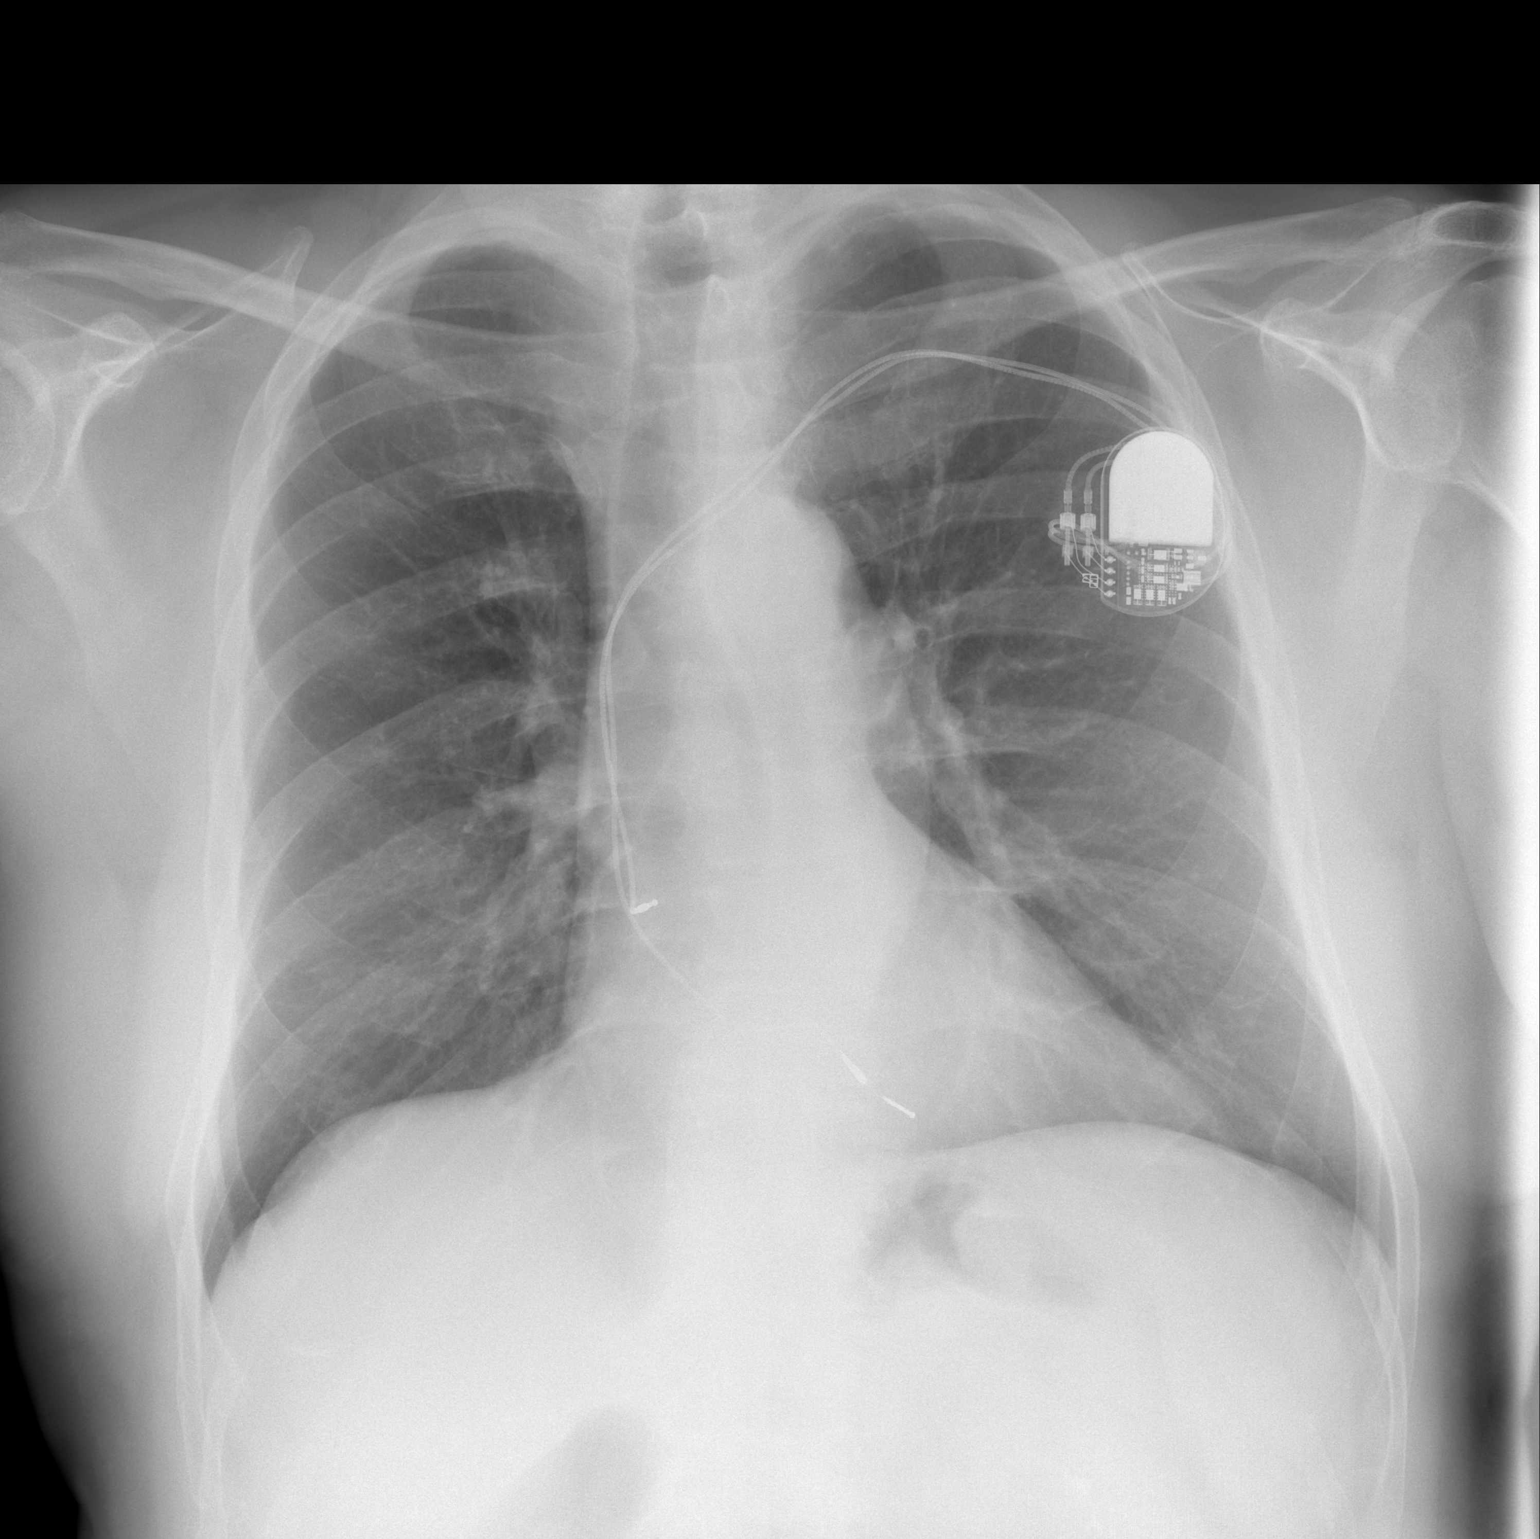

[w chest lat]
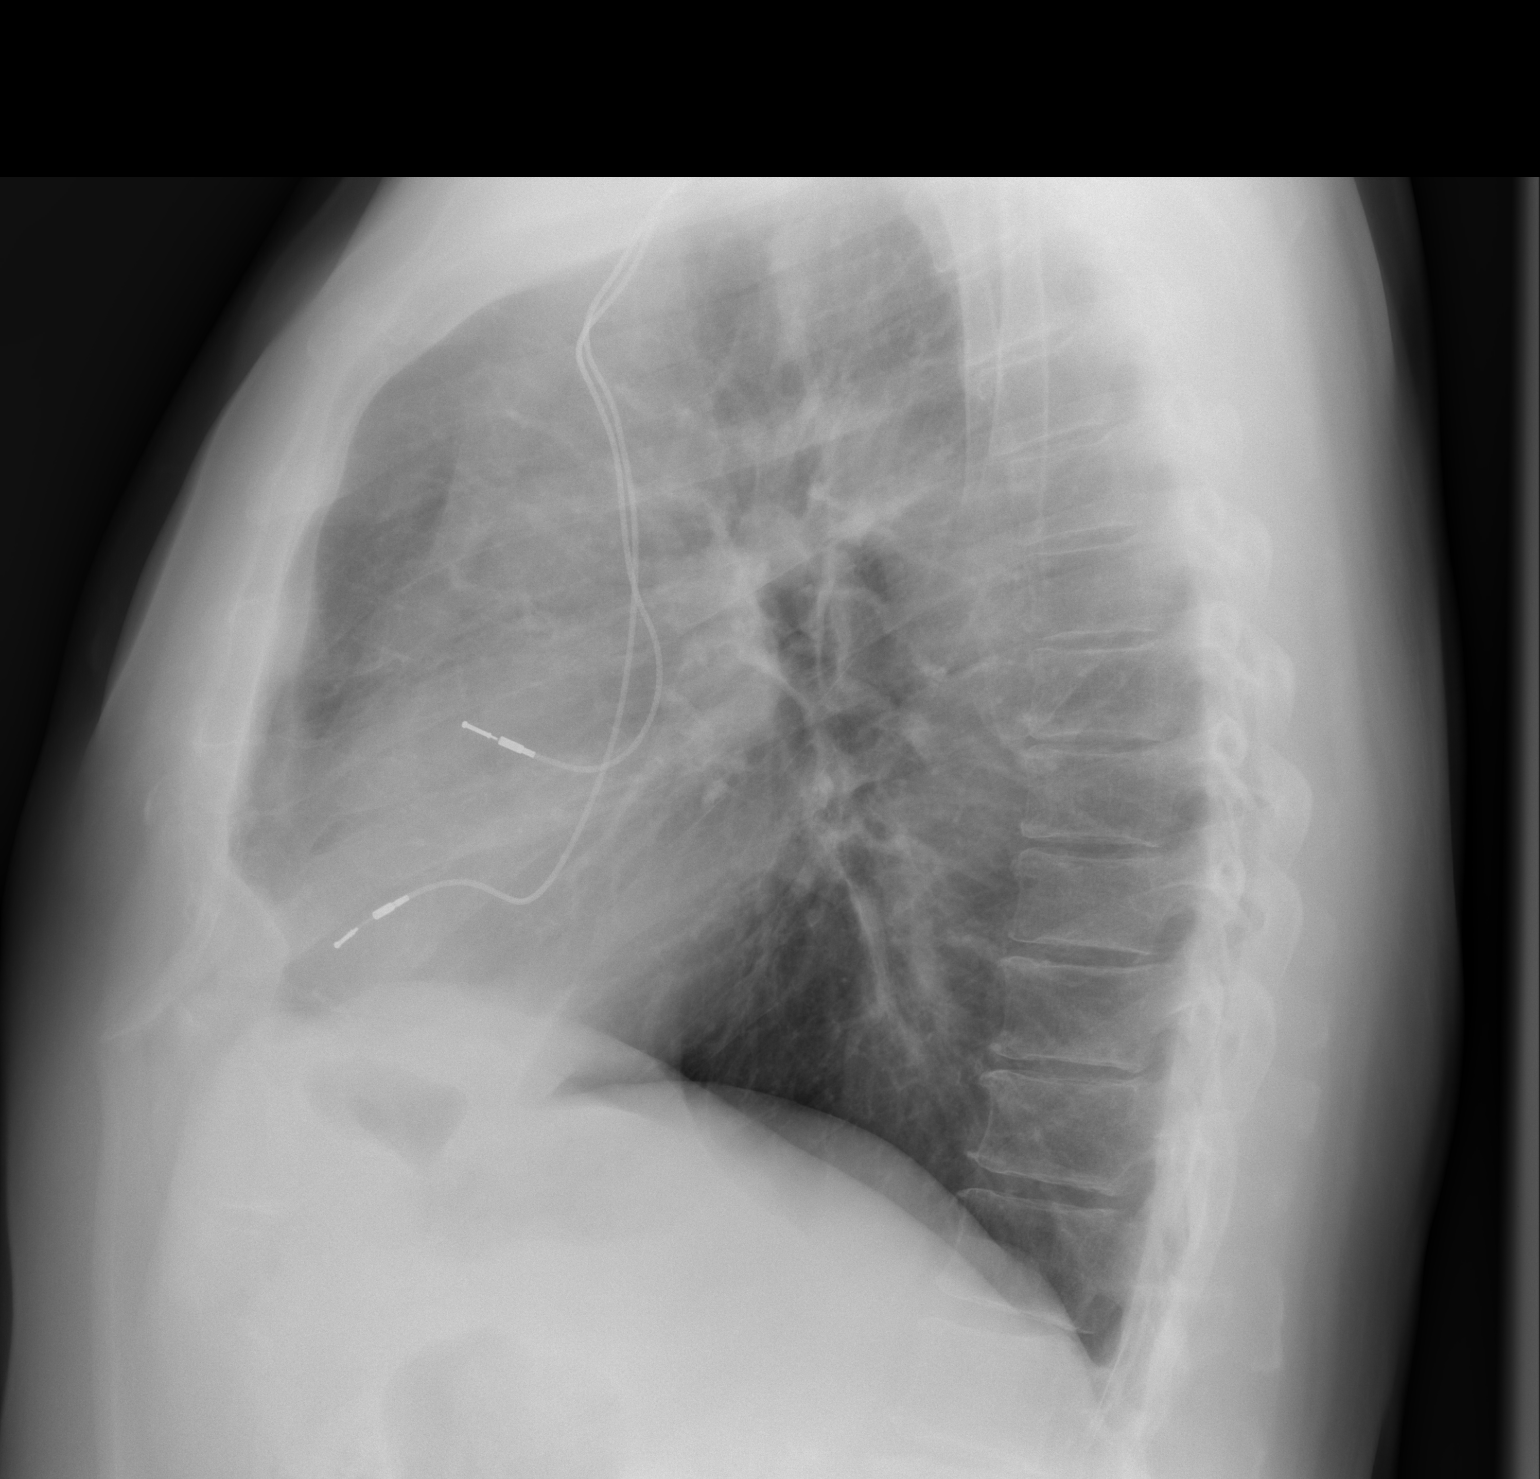

[2 of 2 positions shown; findings below may reference images not displayed]

FINDINGS: The heart size and mediastinal contours are within normal limits.
Both lungs are clear. No pneumothorax or pleural effusion is noted.
Left-sided pacemaker is unchanged in position. The visualized
skeletal structures are unremarkable.
IMPRESSION: No active cardiopulmonary disease.

## 2020-09-23 DIAGNOSIS — Z20822 Contact with and (suspected) exposure to covid-19: Secondary | ICD-10-CM | POA: Diagnosis not present

## 2020-09-27 ENCOUNTER — Ambulatory Visit (INDEPENDENT_AMBULATORY_CARE_PROVIDER_SITE_OTHER): Payer: Medicare Other

## 2020-09-27 DIAGNOSIS — I495 Sick sinus syndrome: Secondary | ICD-10-CM

## 2020-09-27 LAB — CUP PACEART REMOTE DEVICE CHECK
Battery Impedance: 283 Ohm
Battery Remaining Longevity: 114 mo
Battery Voltage: 2.79 V
Brady Statistic AP VP Percent: 0 %
Brady Statistic AP VS Percent: 0 %
Brady Statistic AS VP Percent: 0 %
Brady Statistic AS VS Percent: 100 %
Date Time Interrogation Session: 20220714072217
Implantable Lead Implant Date: 20050503
Implantable Lead Implant Date: 20050503
Implantable Lead Location: 753859
Implantable Lead Location: 753860
Implantable Lead Model: 5092
Implantable Lead Model: 5594
Implantable Pulse Generator Implant Date: 20150922
Lead Channel Impedance Value: 564 Ohm
Lead Channel Impedance Value: 572 Ohm
Lead Channel Pacing Threshold Amplitude: 0.375 V
Lead Channel Pacing Threshold Pulse Width: 0.4 ms
Lead Channel Setting Pacing Amplitude: 1.5 V
Lead Channel Setting Pacing Amplitude: 5 V
Lead Channel Setting Pacing Pulse Width: 1 ms
Lead Channel Setting Sensing Sensitivity: 2 mV

## 2020-10-08 DIAGNOSIS — G4733 Obstructive sleep apnea (adult) (pediatric): Secondary | ICD-10-CM | POA: Diagnosis not present

## 2020-10-10 ENCOUNTER — Ambulatory Visit (INDEPENDENT_AMBULATORY_CARE_PROVIDER_SITE_OTHER): Payer: Medicare Other | Admitting: Physician Assistant

## 2020-10-10 ENCOUNTER — Other Ambulatory Visit: Payer: Self-pay

## 2020-10-10 ENCOUNTER — Encounter: Payer: Self-pay | Admitting: Physician Assistant

## 2020-10-10 DIAGNOSIS — L570 Actinic keratosis: Secondary | ICD-10-CM

## 2020-10-10 DIAGNOSIS — L851 Acquired keratosis [keratoderma] palmaris et plantaris: Secondary | ICD-10-CM

## 2020-10-10 DIAGNOSIS — D485 Neoplasm of uncertain behavior of skin: Secondary | ICD-10-CM | POA: Diagnosis not present

## 2020-10-10 DIAGNOSIS — Z1283 Encounter for screening for malignant neoplasm of skin: Secondary | ICD-10-CM

## 2020-10-10 NOTE — Progress Notes (Signed)
   New Patient   Subjective  Brent Stuart is a 70 y.o. male who presents for the following: Annual Exam (Here for annual skin exam. Concerns left shin. Patient shave part of it off cause its irritated. /Left upper posterior thigh an irritated lesion. Scalp has some crusty lesions that get irritated and sore. ).   The following portions of the chart were reviewed this encounter and updated as appropriate:  Tobacco  Allergies  Meds  Problems  Med Hx  Surg Hx  Fam Hx      Objective  Well appearing patient in no apparent distress; mood and affect are within normal limits.  A full examination was performed including scalp, head, eyes, ears, nose, lips, neck, chest, axillae, abdomen, back, buttocks, bilateral upper extremities, bilateral lower extremities, hands, feet, fingers, toes, fingernails, and toenails. All findings within normal limits unless otherwise noted below.  Left Lower Leg - Anterior Raised growth on pink base      Head - Anterior (Face) (5), Left Thigh - Anterior, Scalp (10) Erythematous patches with gritty scale.  Left Ankle - Anterior, Right Ankle - Anterior White crusts  Assessment & Plan  Neoplasm of uncertain behavior of skin Left Lower Leg - Anterior  Skin / nail biopsy Type of biopsy: tangential   Informed consent: discussed and consent obtained   Timeout: patient name, date of birth, surgical site, and procedure verified   Anesthesia: the lesion was anesthetized in a standard fashion   Anesthetic:  1% lidocaine w/ epinephrine 1-100,000 local infiltration Instrument used: flexible razor blade   Hemostasis achieved with: aluminum chloride and electrodesiccation   Outcome: patient tolerated procedure well   Post-procedure details: wound care instructions given    Specimen 1 - Surgical pathology Differential Diagnosis: isk  Check Margins: No  AK (actinic keratosis) (16) Head - Anterior (Face) (5); Left Thigh - Anterior; Scalp  (10)  Destruction of lesion - Head - Anterior (Face), Left Thigh - Anterior, Scalp Complexity: simple   Destruction method: cryotherapy   Informed consent: discussed and consent obtained   Timeout:  patient name, date of birth, surgical site, and procedure verified Lesion destroyed using liquid nitrogen: Yes   Cryotherapy cycles:  4 Outcome: patient tolerated procedure well with no complications   Post-procedure details: wound care instructions given    Stucco keratosis (2) Left Ankle - Anterior; Right Ankle - Anterior  observe    I, Endya Austin, PA-C, have reviewed all documentation's for this visit.  The documentation on 10/10/20 for the exam, diagnosis, procedures and orders are all accurate and complete.

## 2020-10-10 NOTE — Patient Instructions (Signed)

## 2020-10-18 ENCOUNTER — Telehealth: Payer: Self-pay

## 2020-10-18 NOTE — Telephone Encounter (Signed)
Phone call to patient with his pathology results. Voicemail left with results for patient.

## 2020-10-18 NOTE — Telephone Encounter (Signed)
-----   Message from Kelli R Sheffield, PA-C sent at 10/17/2020  8:58 AM EDT ----- RTC if recurs 

## 2020-10-20 NOTE — Progress Notes (Signed)
Remote pacemaker transmission.   

## 2020-10-26 DIAGNOSIS — Z23 Encounter for immunization: Secondary | ICD-10-CM | POA: Diagnosis not present

## 2020-10-26 DIAGNOSIS — Z20822 Contact with and (suspected) exposure to covid-19: Secondary | ICD-10-CM | POA: Diagnosis not present

## 2020-11-27 DIAGNOSIS — Z0000 Encounter for general adult medical examination without abnormal findings: Secondary | ICD-10-CM | POA: Diagnosis not present

## 2020-11-27 DIAGNOSIS — E782 Mixed hyperlipidemia: Secondary | ICD-10-CM | POA: Diagnosis not present

## 2020-11-27 DIAGNOSIS — F419 Anxiety disorder, unspecified: Secondary | ICD-10-CM | POA: Diagnosis not present

## 2020-11-27 DIAGNOSIS — G4733 Obstructive sleep apnea (adult) (pediatric): Secondary | ICD-10-CM | POA: Diagnosis not present

## 2020-11-27 DIAGNOSIS — I10 Essential (primary) hypertension: Secondary | ICD-10-CM | POA: Diagnosis not present

## 2020-11-27 DIAGNOSIS — Z125 Encounter for screening for malignant neoplasm of prostate: Secondary | ICD-10-CM | POA: Diagnosis not present

## 2020-11-27 DIAGNOSIS — Z1211 Encounter for screening for malignant neoplasm of colon: Secondary | ICD-10-CM | POA: Diagnosis not present

## 2020-11-27 DIAGNOSIS — Z1389 Encounter for screening for other disorder: Secondary | ICD-10-CM | POA: Diagnosis not present

## 2020-11-27 DIAGNOSIS — K219 Gastro-esophageal reflux disease without esophagitis: Secondary | ICD-10-CM | POA: Diagnosis not present

## 2020-12-04 DIAGNOSIS — Z1211 Encounter for screening for malignant neoplasm of colon: Secondary | ICD-10-CM | POA: Diagnosis not present

## 2020-12-18 DIAGNOSIS — Z20822 Contact with and (suspected) exposure to covid-19: Secondary | ICD-10-CM | POA: Diagnosis not present

## 2020-12-27 ENCOUNTER — Ambulatory Visit (INDEPENDENT_AMBULATORY_CARE_PROVIDER_SITE_OTHER): Payer: Medicare Other

## 2020-12-27 DIAGNOSIS — I495 Sick sinus syndrome: Secondary | ICD-10-CM

## 2020-12-27 LAB — CUP PACEART REMOTE DEVICE CHECK
Battery Impedance: 283 Ohm
Battery Remaining Longevity: 113 mo
Battery Voltage: 2.78 V
Brady Statistic AP VP Percent: 0 %
Brady Statistic AP VS Percent: 0 %
Brady Statistic AS VP Percent: 0 %
Brady Statistic AS VS Percent: 100 %
Date Time Interrogation Session: 20221013081326
Implantable Lead Implant Date: 20050503
Implantable Lead Implant Date: 20050503
Implantable Lead Location: 753859
Implantable Lead Location: 753860
Implantable Lead Model: 5092
Implantable Lead Model: 5594
Implantable Pulse Generator Implant Date: 20150922
Lead Channel Impedance Value: 529 Ohm
Lead Channel Impedance Value: 543 Ohm
Lead Channel Pacing Threshold Amplitude: 0.5 V
Lead Channel Pacing Threshold Pulse Width: 0.4 ms
Lead Channel Setting Pacing Amplitude: 1.5 V
Lead Channel Setting Pacing Amplitude: 5 V
Lead Channel Setting Pacing Pulse Width: 1 ms
Lead Channel Setting Sensing Sensitivity: 2.8 mV

## 2021-01-04 NOTE — Progress Notes (Signed)
Remote pacemaker transmission.   

## 2021-01-09 DIAGNOSIS — H18513 Endothelial corneal dystrophy, bilateral: Secondary | ICD-10-CM | POA: Diagnosis not present

## 2021-01-09 DIAGNOSIS — H52203 Unspecified astigmatism, bilateral: Secondary | ICD-10-CM | POA: Diagnosis not present

## 2021-01-11 DIAGNOSIS — M47812 Spondylosis without myelopathy or radiculopathy, cervical region: Secondary | ICD-10-CM | POA: Diagnosis not present

## 2021-01-11 DIAGNOSIS — I10 Essential (primary) hypertension: Secondary | ICD-10-CM | POA: Diagnosis not present

## 2021-01-11 DIAGNOSIS — K219 Gastro-esophageal reflux disease without esophagitis: Secondary | ICD-10-CM | POA: Diagnosis not present

## 2021-01-11 DIAGNOSIS — E782 Mixed hyperlipidemia: Secondary | ICD-10-CM | POA: Diagnosis not present

## 2021-01-23 ENCOUNTER — Other Ambulatory Visit: Payer: Self-pay | Admitting: Physician Assistant

## 2021-01-23 ENCOUNTER — Ambulatory Visit
Admission: RE | Admit: 2021-01-23 | Discharge: 2021-01-23 | Disposition: A | Discharge status: Home / Self Care | Payer: Medicare Other | Source: Ambulatory Visit | Attending: Physician Assistant | Admitting: Physician Assistant

## 2021-01-23 DIAGNOSIS — R051 Acute cough: Secondary | ICD-10-CM | POA: Diagnosis not present

## 2021-01-23 DIAGNOSIS — R059 Cough, unspecified: Secondary | ICD-10-CM | POA: Diagnosis not present

## 2021-01-23 DIAGNOSIS — R0609 Other forms of dyspnea: Secondary | ICD-10-CM | POA: Diagnosis not present

## 2021-01-23 DIAGNOSIS — R311 Benign essential microscopic hematuria: Secondary | ICD-10-CM | POA: Diagnosis not present

## 2021-03-11 DIAGNOSIS — M47812 Spondylosis without myelopathy or radiculopathy, cervical region: Secondary | ICD-10-CM | POA: Diagnosis not present

## 2021-03-11 DIAGNOSIS — I10 Essential (primary) hypertension: Secondary | ICD-10-CM | POA: Diagnosis not present

## 2021-03-11 DIAGNOSIS — E782 Mixed hyperlipidemia: Secondary | ICD-10-CM | POA: Diagnosis not present

## 2021-03-11 DIAGNOSIS — K219 Gastro-esophageal reflux disease without esophagitis: Secondary | ICD-10-CM | POA: Diagnosis not present

## 2021-03-24 DIAGNOSIS — Z20822 Contact with and (suspected) exposure to covid-19: Secondary | ICD-10-CM | POA: Diagnosis not present

## 2021-03-28 ENCOUNTER — Ambulatory Visit (INDEPENDENT_AMBULATORY_CARE_PROVIDER_SITE_OTHER): Payer: Medicare Other

## 2021-03-28 DIAGNOSIS — I495 Sick sinus syndrome: Secondary | ICD-10-CM | POA: Diagnosis not present

## 2021-03-29 LAB — CUP PACEART REMOTE DEVICE CHECK
Battery Impedance: 307 Ohm
Battery Remaining Longevity: 110 mo
Battery Voltage: 2.77 V
Brady Statistic AP VP Percent: 0 %
Brady Statistic AP VS Percent: 0 %
Brady Statistic AS VP Percent: 0 %
Brady Statistic AS VS Percent: 100 %
Date Time Interrogation Session: 20230113103910
Implantable Lead Implant Date: 20050503
Implantable Lead Implant Date: 20050503
Implantable Lead Location: 753859
Implantable Lead Location: 753860
Implantable Lead Model: 5092
Implantable Lead Model: 5594
Implantable Pulse Generator Implant Date: 20150922
Lead Channel Impedance Value: 545 Ohm
Lead Channel Impedance Value: 555 Ohm
Lead Channel Pacing Threshold Amplitude: 0.5 V
Lead Channel Pacing Threshold Pulse Width: 0.4 ms
Lead Channel Setting Pacing Amplitude: 1.5 V
Lead Channel Setting Pacing Amplitude: 5 V
Lead Channel Setting Pacing Pulse Width: 1 ms
Lead Channel Setting Sensing Sensitivity: 2.8 mV

## 2021-04-09 NOTE — Progress Notes (Signed)
Remote pacemaker transmission.   

## 2021-04-23 DIAGNOSIS — H2513 Age-related nuclear cataract, bilateral: Secondary | ICD-10-CM | POA: Diagnosis not present

## 2021-04-23 DIAGNOSIS — H1789 Other corneal scars and opacities: Secondary | ICD-10-CM | POA: Diagnosis not present

## 2021-04-23 DIAGNOSIS — H18513 Endothelial corneal dystrophy, bilateral: Secondary | ICD-10-CM | POA: Diagnosis not present

## 2021-06-06 DIAGNOSIS — Z20822 Contact with and (suspected) exposure to covid-19: Secondary | ICD-10-CM | POA: Diagnosis not present

## 2021-06-27 ENCOUNTER — Ambulatory Visit (INDEPENDENT_AMBULATORY_CARE_PROVIDER_SITE_OTHER): Payer: Medicare Other

## 2021-06-27 DIAGNOSIS — I495 Sick sinus syndrome: Secondary | ICD-10-CM

## 2021-06-27 LAB — CUP PACEART REMOTE DEVICE CHECK
Battery Impedance: 357 Ohm
Battery Remaining Longevity: 105 mo
Battery Voltage: 2.77 V
Brady Statistic AP VP Percent: 0 %
Brady Statistic AP VS Percent: 0 %
Brady Statistic AS VP Percent: 0 %
Brady Statistic AS VS Percent: 100 %
Date Time Interrogation Session: 20230413080348
Implantable Lead Implant Date: 20050503
Implantable Lead Implant Date: 20050503
Implantable Lead Location: 753859
Implantable Lead Location: 753860
Implantable Lead Model: 5092
Implantable Lead Model: 5594
Implantable Pulse Generator Implant Date: 20150922
Lead Channel Impedance Value: 538 Ohm
Lead Channel Impedance Value: 563 Ohm
Lead Channel Pacing Threshold Amplitude: 0.625 V
Lead Channel Pacing Threshold Pulse Width: 0.4 ms
Lead Channel Setting Pacing Amplitude: 1.5 V
Lead Channel Setting Pacing Amplitude: 5 V
Lead Channel Setting Pacing Pulse Width: 1 ms
Lead Channel Setting Sensing Sensitivity: 2 mV

## 2021-07-12 DIAGNOSIS — Z20822 Contact with and (suspected) exposure to covid-19: Secondary | ICD-10-CM | POA: Diagnosis not present

## 2021-07-15 NOTE — Progress Notes (Signed)
Remote pacemaker transmission.   

## 2021-07-17 DIAGNOSIS — Z20822 Contact with and (suspected) exposure to covid-19: Secondary | ICD-10-CM | POA: Diagnosis not present

## 2021-09-26 ENCOUNTER — Ambulatory Visit (INDEPENDENT_AMBULATORY_CARE_PROVIDER_SITE_OTHER): Payer: Medicare Other

## 2021-09-26 DIAGNOSIS — I495 Sick sinus syndrome: Secondary | ICD-10-CM | POA: Diagnosis not present

## 2021-09-27 LAB — CUP PACEART REMOTE DEVICE CHECK
Battery Impedance: 357 Ohm
Battery Remaining Longevity: 105 mo
Battery Voltage: 2.77 V
Brady Statistic AP VP Percent: 0 %
Brady Statistic AP VS Percent: 0 %
Brady Statistic AS VP Percent: 0 %
Brady Statistic AS VS Percent: 100 %
Date Time Interrogation Session: 20230713080403
Implantable Lead Implant Date: 20050503
Implantable Lead Implant Date: 20050503
Implantable Lead Location: 753859
Implantable Lead Location: 753860
Implantable Lead Model: 5092
Implantable Lead Model: 5594
Implantable Pulse Generator Implant Date: 20150922
Lead Channel Impedance Value: 545 Ohm
Lead Channel Impedance Value: 551 Ohm
Lead Channel Pacing Threshold Amplitude: 0.5 V
Lead Channel Pacing Threshold Pulse Width: 0.4 ms
Lead Channel Setting Pacing Amplitude: 1.5 V
Lead Channel Setting Pacing Amplitude: 5 V
Lead Channel Setting Pacing Pulse Width: 1 ms
Lead Channel Setting Sensing Sensitivity: 2 mV

## 2021-10-08 DIAGNOSIS — G4733 Obstructive sleep apnea (adult) (pediatric): Secondary | ICD-10-CM | POA: Diagnosis not present

## 2021-10-11 NOTE — Progress Notes (Signed)
Remote pacemaker transmission.   

## 2021-11-28 DIAGNOSIS — Z1331 Encounter for screening for depression: Secondary | ICD-10-CM | POA: Diagnosis not present

## 2021-11-28 DIAGNOSIS — Z0000 Encounter for general adult medical examination without abnormal findings: Secondary | ICD-10-CM | POA: Diagnosis not present

## 2021-11-28 DIAGNOSIS — G4733 Obstructive sleep apnea (adult) (pediatric): Secondary | ICD-10-CM | POA: Diagnosis not present

## 2021-11-28 DIAGNOSIS — F419 Anxiety disorder, unspecified: Secondary | ICD-10-CM | POA: Diagnosis not present

## 2021-11-28 DIAGNOSIS — Z23 Encounter for immunization: Secondary | ICD-10-CM | POA: Diagnosis not present

## 2021-11-28 DIAGNOSIS — E782 Mixed hyperlipidemia: Secondary | ICD-10-CM | POA: Diagnosis not present

## 2021-11-28 DIAGNOSIS — K219 Gastro-esophageal reflux disease without esophagitis: Secondary | ICD-10-CM | POA: Diagnosis not present

## 2021-11-28 DIAGNOSIS — I10 Essential (primary) hypertension: Secondary | ICD-10-CM | POA: Diagnosis not present

## 2021-12-16 ENCOUNTER — Emergency Department (HOSPITAL_COMMUNITY)
Admission: EM | Admit: 2021-12-16 | Discharge: 2021-12-16 | Discharge status: Against Medical Advice | Payer: Medicare Other

## 2021-12-16 ENCOUNTER — Other Ambulatory Visit: Payer: Self-pay

## 2021-12-16 ENCOUNTER — Encounter (HOSPITAL_BASED_OUTPATIENT_CLINIC_OR_DEPARTMENT_OTHER): Payer: Self-pay

## 2021-12-16 DIAGNOSIS — H5789 Other specified disorders of eye and adnexa: Secondary | ICD-10-CM | POA: Insufficient documentation

## 2021-12-16 DIAGNOSIS — Z5321 Procedure and treatment not carried out due to patient leaving prior to being seen by health care provider: Secondary | ICD-10-CM | POA: Insufficient documentation

## 2021-12-16 NOTE — ED Triage Notes (Signed)
States he was soft washing house today with 2% bleach and wind shifted and got into his eyes. States they are burning and irritated Flushed eyes out at time of incident

## 2021-12-17 ENCOUNTER — Emergency Department (HOSPITAL_BASED_OUTPATIENT_CLINIC_OR_DEPARTMENT_OTHER)
Admission: EM | Admit: 2021-12-17 | Discharge: 2021-12-17 | Discharge status: Against Medical Advice | Payer: Medicare Other | Attending: Emergency Medicine | Admitting: Emergency Medicine

## 2021-12-17 DIAGNOSIS — H2513 Age-related nuclear cataract, bilateral: Secondary | ICD-10-CM | POA: Diagnosis not present

## 2021-12-17 DIAGNOSIS — H16203 Unspecified keratoconjunctivitis, bilateral: Secondary | ICD-10-CM | POA: Diagnosis not present

## 2021-12-17 DIAGNOSIS — H18513 Endothelial corneal dystrophy, bilateral: Secondary | ICD-10-CM | POA: Diagnosis not present

## 2021-12-26 ENCOUNTER — Ambulatory Visit (INDEPENDENT_AMBULATORY_CARE_PROVIDER_SITE_OTHER): Payer: Medicare Other

## 2021-12-26 DIAGNOSIS — I495 Sick sinus syndrome: Secondary | ICD-10-CM

## 2021-12-26 LAB — CUP PACEART REMOTE DEVICE CHECK
Battery Impedance: 382 Ohm
Battery Remaining Longevity: 103 mo
Battery Voltage: 2.77 V
Brady Statistic AP VP Percent: 0 %
Brady Statistic AP VS Percent: 0 %
Brady Statistic AS VP Percent: 0 %
Brady Statistic AS VS Percent: 100 %
Date Time Interrogation Session: 20231012073853
Implantable Lead Implant Date: 20050503
Implantable Lead Implant Date: 20050503
Implantable Lead Location: 753859
Implantable Lead Location: 753860
Implantable Lead Model: 5092
Implantable Lead Model: 5594
Implantable Pulse Generator Implant Date: 20150922
Lead Channel Impedance Value: 545 Ohm
Lead Channel Impedance Value: 570 Ohm
Lead Channel Pacing Threshold Amplitude: 0.5 V
Lead Channel Pacing Threshold Pulse Width: 0.4 ms
Lead Channel Setting Pacing Amplitude: 1.5 V
Lead Channel Setting Pacing Amplitude: 5 V
Lead Channel Setting Pacing Pulse Width: 1 ms
Lead Channel Setting Sensing Sensitivity: 2 mV

## 2022-01-02 NOTE — Progress Notes (Signed)
Remote pacemaker transmission.   

## 2022-03-27 ENCOUNTER — Telehealth: Payer: Self-pay | Admitting: Cardiovascular Disease

## 2022-03-27 ENCOUNTER — Ambulatory Visit (INDEPENDENT_AMBULATORY_CARE_PROVIDER_SITE_OTHER): Payer: Medicare Other

## 2022-03-27 DIAGNOSIS — I495 Sick sinus syndrome: Secondary | ICD-10-CM | POA: Diagnosis not present

## 2022-03-27 LAB — CUP PACEART REMOTE DEVICE CHECK
Battery Impedance: 407 Ohm
Battery Remaining Longevity: 100 mo
Battery Voltage: 2.77 V
Brady Statistic AP VP Percent: 0 %
Brady Statistic AP VS Percent: 0 %
Brady Statistic AS VP Percent: 0 %
Brady Statistic AS VS Percent: 100 %
Date Time Interrogation Session: 20240111102140
Implantable Lead Connection Status: 753985
Implantable Lead Connection Status: 753985
Implantable Lead Implant Date: 20050503
Implantable Lead Implant Date: 20050503
Implantable Lead Location: 753859
Implantable Lead Location: 753860
Implantable Lead Model: 5092
Implantable Lead Model: 5594
Implantable Pulse Generator Implant Date: 20150922
Lead Channel Impedance Value: 545 Ohm
Lead Channel Impedance Value: 583 Ohm
Lead Channel Pacing Threshold Amplitude: 0.625 V
Lead Channel Pacing Threshold Pulse Width: 0.4 ms
Lead Channel Setting Pacing Amplitude: 1.5 V
Lead Channel Setting Pacing Amplitude: 5 V
Lead Channel Setting Pacing Pulse Width: 1 ms
Lead Channel Setting Sensing Sensitivity: 2.8 mV
Zone Setting Status: 755011
Zone Setting Status: 755011

## 2022-03-27 NOTE — Telephone Encounter (Signed)
Spoke with patient.  Actually, he just had questions regarding the settings on his mychart and wondering why he was not getting appointment reminders any further. He has not had any issues with getting his results.  Patient given number to [facility] chart help line:  [phone].to review his settings and troubleshoot his mychart access.   Also, given number for device clinic should he have any questions moving forward.

## 2022-03-27 NOTE — Telephone Encounter (Signed)
  Pt said, he is not seeing his remote pacer checks on his mychart as well his results. The last information he can access there is on 09/24/20. He is asking if there's anyway to fix it

## 2022-04-16 NOTE — Progress Notes (Signed)
Remote pacemaker transmission.   

## 2022-05-07 DIAGNOSIS — H52203 Unspecified astigmatism, bilateral: Secondary | ICD-10-CM | POA: Diagnosis not present

## 2022-05-07 DIAGNOSIS — H2513 Age-related nuclear cataract, bilateral: Secondary | ICD-10-CM | POA: Diagnosis not present

## 2022-05-28 DIAGNOSIS — I10 Essential (primary) hypertension: Secondary | ICD-10-CM | POA: Diagnosis not present

## 2022-05-28 DIAGNOSIS — Z85528 Personal history of other malignant neoplasm of kidney: Secondary | ICD-10-CM | POA: Diagnosis not present

## 2022-05-28 DIAGNOSIS — Z950 Presence of cardiac pacemaker: Secondary | ICD-10-CM | POA: Diagnosis not present

## 2022-05-28 DIAGNOSIS — K625 Hemorrhage of anus and rectum: Secondary | ICD-10-CM | POA: Diagnosis not present

## 2022-05-29 DIAGNOSIS — K580 Irritable bowel syndrome with diarrhea: Secondary | ICD-10-CM | POA: Diagnosis not present

## 2022-05-29 DIAGNOSIS — Z950 Presence of cardiac pacemaker: Secondary | ICD-10-CM | POA: Diagnosis not present

## 2022-05-29 DIAGNOSIS — I495 Sick sinus syndrome: Secondary | ICD-10-CM | POA: Diagnosis not present

## 2022-05-29 DIAGNOSIS — K625 Hemorrhage of anus and rectum: Secondary | ICD-10-CM | POA: Diagnosis not present

## 2022-06-26 ENCOUNTER — Ambulatory Visit (INDEPENDENT_AMBULATORY_CARE_PROVIDER_SITE_OTHER): Payer: Medicare Other

## 2022-06-26 DIAGNOSIS — I495 Sick sinus syndrome: Secondary | ICD-10-CM

## 2022-06-26 LAB — CUP PACEART REMOTE DEVICE CHECK
Battery Impedance: 479 Ohm
Battery Remaining Longevity: 93 mo
Battery Voltage: 2.77 V
Brady Statistic AP VP Percent: 0 %
Brady Statistic AP VS Percent: 0 %
Brady Statistic AS VP Percent: 0 %
Brady Statistic AS VS Percent: 100 %
Date Time Interrogation Session: 20240411084847
Implantable Lead Connection Status: 753985
Implantable Lead Connection Status: 753985
Implantable Lead Implant Date: 20050503
Implantable Lead Implant Date: 20050503
Implantable Lead Location: 753859
Implantable Lead Location: 753860
Implantable Lead Model: 5092
Implantable Lead Model: 5594
Implantable Pulse Generator Implant Date: 20150922
Lead Channel Impedance Value: 529 Ohm
Lead Channel Impedance Value: 560 Ohm
Lead Channel Pacing Threshold Amplitude: 0.625 V
Lead Channel Pacing Threshold Pulse Width: 0.4 ms
Lead Channel Setting Pacing Amplitude: 1.5 V
Lead Channel Setting Pacing Amplitude: 5 V
Lead Channel Setting Pacing Pulse Width: 1 ms
Lead Channel Setting Sensing Sensitivity: 2 mV
Zone Setting Status: 755011
Zone Setting Status: 755011

## 2022-07-02 DIAGNOSIS — K921 Melena: Secondary | ICD-10-CM | POA: Diagnosis not present

## 2022-07-02 DIAGNOSIS — K219 Gastro-esophageal reflux disease without esophagitis: Secondary | ICD-10-CM | POA: Diagnosis not present

## 2022-07-02 DIAGNOSIS — Z980 Intestinal bypass and anastomosis status: Secondary | ICD-10-CM | POA: Diagnosis not present

## 2022-07-02 DIAGNOSIS — K648 Other hemorrhoids: Secondary | ICD-10-CM | POA: Diagnosis not present

## 2022-07-02 DIAGNOSIS — K449 Diaphragmatic hernia without obstruction or gangrene: Secondary | ICD-10-CM | POA: Diagnosis not present

## 2022-07-03 ENCOUNTER — Ambulatory Visit
Admission: RE | Admit: 2022-07-03 | Discharge: 2022-07-03 | Disposition: A | Discharge status: Home / Self Care | Payer: Medicare Other | Source: Ambulatory Visit | Attending: Internal Medicine | Admitting: Internal Medicine

## 2022-07-03 ENCOUNTER — Other Ambulatory Visit: Payer: Self-pay | Admitting: Internal Medicine

## 2022-07-03 DIAGNOSIS — R059 Cough, unspecified: Secondary | ICD-10-CM

## 2022-07-31 NOTE — Progress Notes (Signed)
Remote pacemaker transmission.   

## 2022-08-08 DIAGNOSIS — R053 Chronic cough: Secondary | ICD-10-CM | POA: Diagnosis not present

## 2022-08-08 DIAGNOSIS — Z1211 Encounter for screening for malignant neoplasm of colon: Secondary | ICD-10-CM | POA: Diagnosis not present

## 2022-08-08 DIAGNOSIS — K219 Gastro-esophageal reflux disease without esophagitis: Secondary | ICD-10-CM | POA: Diagnosis not present

## 2022-08-08 DIAGNOSIS — K921 Melena: Secondary | ICD-10-CM | POA: Diagnosis not present

## 2022-09-15 DIAGNOSIS — L821 Other seborrheic keratosis: Secondary | ICD-10-CM | POA: Diagnosis not present

## 2022-09-15 DIAGNOSIS — L570 Actinic keratosis: Secondary | ICD-10-CM | POA: Diagnosis not present

## 2022-09-15 DIAGNOSIS — D225 Melanocytic nevi of trunk: Secondary | ICD-10-CM | POA: Diagnosis not present

## 2022-09-15 DIAGNOSIS — D2221 Melanocytic nevi of right ear and external auricular canal: Secondary | ICD-10-CM | POA: Diagnosis not present

## 2022-09-15 DIAGNOSIS — D1801 Hemangioma of skin and subcutaneous tissue: Secondary | ICD-10-CM | POA: Diagnosis not present

## 2022-09-25 ENCOUNTER — Ambulatory Visit (INDEPENDENT_AMBULATORY_CARE_PROVIDER_SITE_OTHER): Payer: Medicare Other

## 2022-09-25 DIAGNOSIS — I495 Sick sinus syndrome: Secondary | ICD-10-CM

## 2022-09-25 LAB — CUP PACEART REMOTE DEVICE CHECK
Battery Impedance: 529 Ohm
Battery Remaining Longevity: 88 mo
Battery Voltage: 2.78 V
Brady Statistic AP VP Percent: 0 %
Brady Statistic AP VS Percent: 0 %
Brady Statistic AS VP Percent: 0 %
Brady Statistic AS VS Percent: 100 %
Date Time Interrogation Session: 20240711074014
Implantable Lead Connection Status: 753985
Implantable Lead Connection Status: 753985
Implantable Lead Implant Date: 20050503
Implantable Lead Implant Date: 20050503
Implantable Lead Location: 753859
Implantable Lead Location: 753860
Implantable Lead Model: 5092
Implantable Lead Model: 5594
Implantable Pulse Generator Implant Date: 20150922
Lead Channel Impedance Value: 546 Ohm
Lead Channel Impedance Value: 560 Ohm
Lead Channel Pacing Threshold Amplitude: 0.625 V
Lead Channel Pacing Threshold Pulse Width: 0.4 ms
Lead Channel Setting Pacing Amplitude: 1.5 V
Lead Channel Setting Pacing Amplitude: 5 V
Lead Channel Setting Pacing Pulse Width: 1 ms
Lead Channel Setting Sensing Sensitivity: 2.8 mV
Zone Setting Status: 755011
Zone Setting Status: 755011

## 2022-10-09 DIAGNOSIS — G4733 Obstructive sleep apnea (adult) (pediatric): Secondary | ICD-10-CM | POA: Diagnosis not present

## 2022-10-09 DIAGNOSIS — I10 Essential (primary) hypertension: Secondary | ICD-10-CM | POA: Diagnosis not present

## 2022-10-14 NOTE — Progress Notes (Signed)
Remote pacemaker transmission.   

## 2022-12-05 DIAGNOSIS — Z0000 Encounter for general adult medical examination without abnormal findings: Secondary | ICD-10-CM | POA: Diagnosis not present

## 2022-12-05 DIAGNOSIS — Z950 Presence of cardiac pacemaker: Secondary | ICD-10-CM | POA: Diagnosis not present

## 2022-12-05 DIAGNOSIS — I10 Essential (primary) hypertension: Secondary | ICD-10-CM | POA: Diagnosis not present

## 2022-12-05 DIAGNOSIS — K760 Fatty (change of) liver, not elsewhere classified: Secondary | ICD-10-CM | POA: Diagnosis not present

## 2022-12-05 DIAGNOSIS — Z23 Encounter for immunization: Secondary | ICD-10-CM | POA: Diagnosis not present

## 2022-12-05 DIAGNOSIS — E782 Mixed hyperlipidemia: Secondary | ICD-10-CM | POA: Diagnosis not present

## 2022-12-05 DIAGNOSIS — F5104 Psychophysiologic insomnia: Secondary | ICD-10-CM | POA: Diagnosis not present

## 2022-12-05 DIAGNOSIS — F419 Anxiety disorder, unspecified: Secondary | ICD-10-CM | POA: Diagnosis not present

## 2022-12-05 DIAGNOSIS — K219 Gastro-esophageal reflux disease without esophagitis: Secondary | ICD-10-CM | POA: Diagnosis not present

## 2022-12-05 DIAGNOSIS — K5790 Diverticulosis of intestine, part unspecified, without perforation or abscess without bleeding: Secondary | ICD-10-CM | POA: Diagnosis not present

## 2022-12-05 DIAGNOSIS — K580 Irritable bowel syndrome with diarrhea: Secondary | ICD-10-CM | POA: Diagnosis not present

## 2022-12-05 DIAGNOSIS — Z85528 Personal history of other malignant neoplasm of kidney: Secondary | ICD-10-CM | POA: Diagnosis not present

## 2022-12-05 DIAGNOSIS — M47812 Spondylosis without myelopathy or radiculopathy, cervical region: Secondary | ICD-10-CM | POA: Diagnosis not present

## 2022-12-05 DIAGNOSIS — Z125 Encounter for screening for malignant neoplasm of prostate: Secondary | ICD-10-CM | POA: Diagnosis not present

## 2022-12-05 DIAGNOSIS — Z1331 Encounter for screening for depression: Secondary | ICD-10-CM | POA: Diagnosis not present

## 2022-12-05 DIAGNOSIS — G4733 Obstructive sleep apnea (adult) (pediatric): Secondary | ICD-10-CM | POA: Diagnosis not present

## 2022-12-25 ENCOUNTER — Ambulatory Visit: Payer: Medicare Other

## 2022-12-25 DIAGNOSIS — I495 Sick sinus syndrome: Secondary | ICD-10-CM

## 2022-12-25 LAB — CUP PACEART REMOTE DEVICE CHECK
Battery Impedance: 554 Ohm
Battery Remaining Longevity: 87 mo
Battery Voltage: 2.77 V
Brady Statistic AP VP Percent: 0 %
Brady Statistic AP VS Percent: 0 %
Brady Statistic AS VP Percent: 0 %
Brady Statistic AS VS Percent: 100 %
Date Time Interrogation Session: 20241010081712
Implantable Lead Connection Status: 753985
Implantable Lead Connection Status: 753985
Implantable Lead Implant Date: 20050503
Implantable Lead Implant Date: 20050503
Implantable Lead Location: 753859
Implantable Lead Location: 753860
Implantable Lead Model: 5092
Implantable Lead Model: 5594
Implantable Pulse Generator Implant Date: 20150922
Lead Channel Impedance Value: 546 Ohm
Lead Channel Impedance Value: 580 Ohm
Lead Channel Pacing Threshold Amplitude: 0.625 V
Lead Channel Pacing Threshold Pulse Width: 0.4 ms
Lead Channel Setting Pacing Amplitude: 1.5 V
Lead Channel Setting Pacing Amplitude: 5 V
Lead Channel Setting Pacing Pulse Width: 1 ms
Lead Channel Setting Sensing Sensitivity: 2.8 mV
Zone Setting Status: 755011
Zone Setting Status: 755011

## 2022-12-25 IMAGING — CR DG CHEST 2V
2 series · 2 of 2 positions shown · non-contrast
Comparison: X-ray chest 10/06/2018.

CLINICAL DATA: Dry cough for 3 weeks with some dyspnea on exertion
since having [id number].

EXAM:
CHEST - 2 VIEW

[w chest pa]
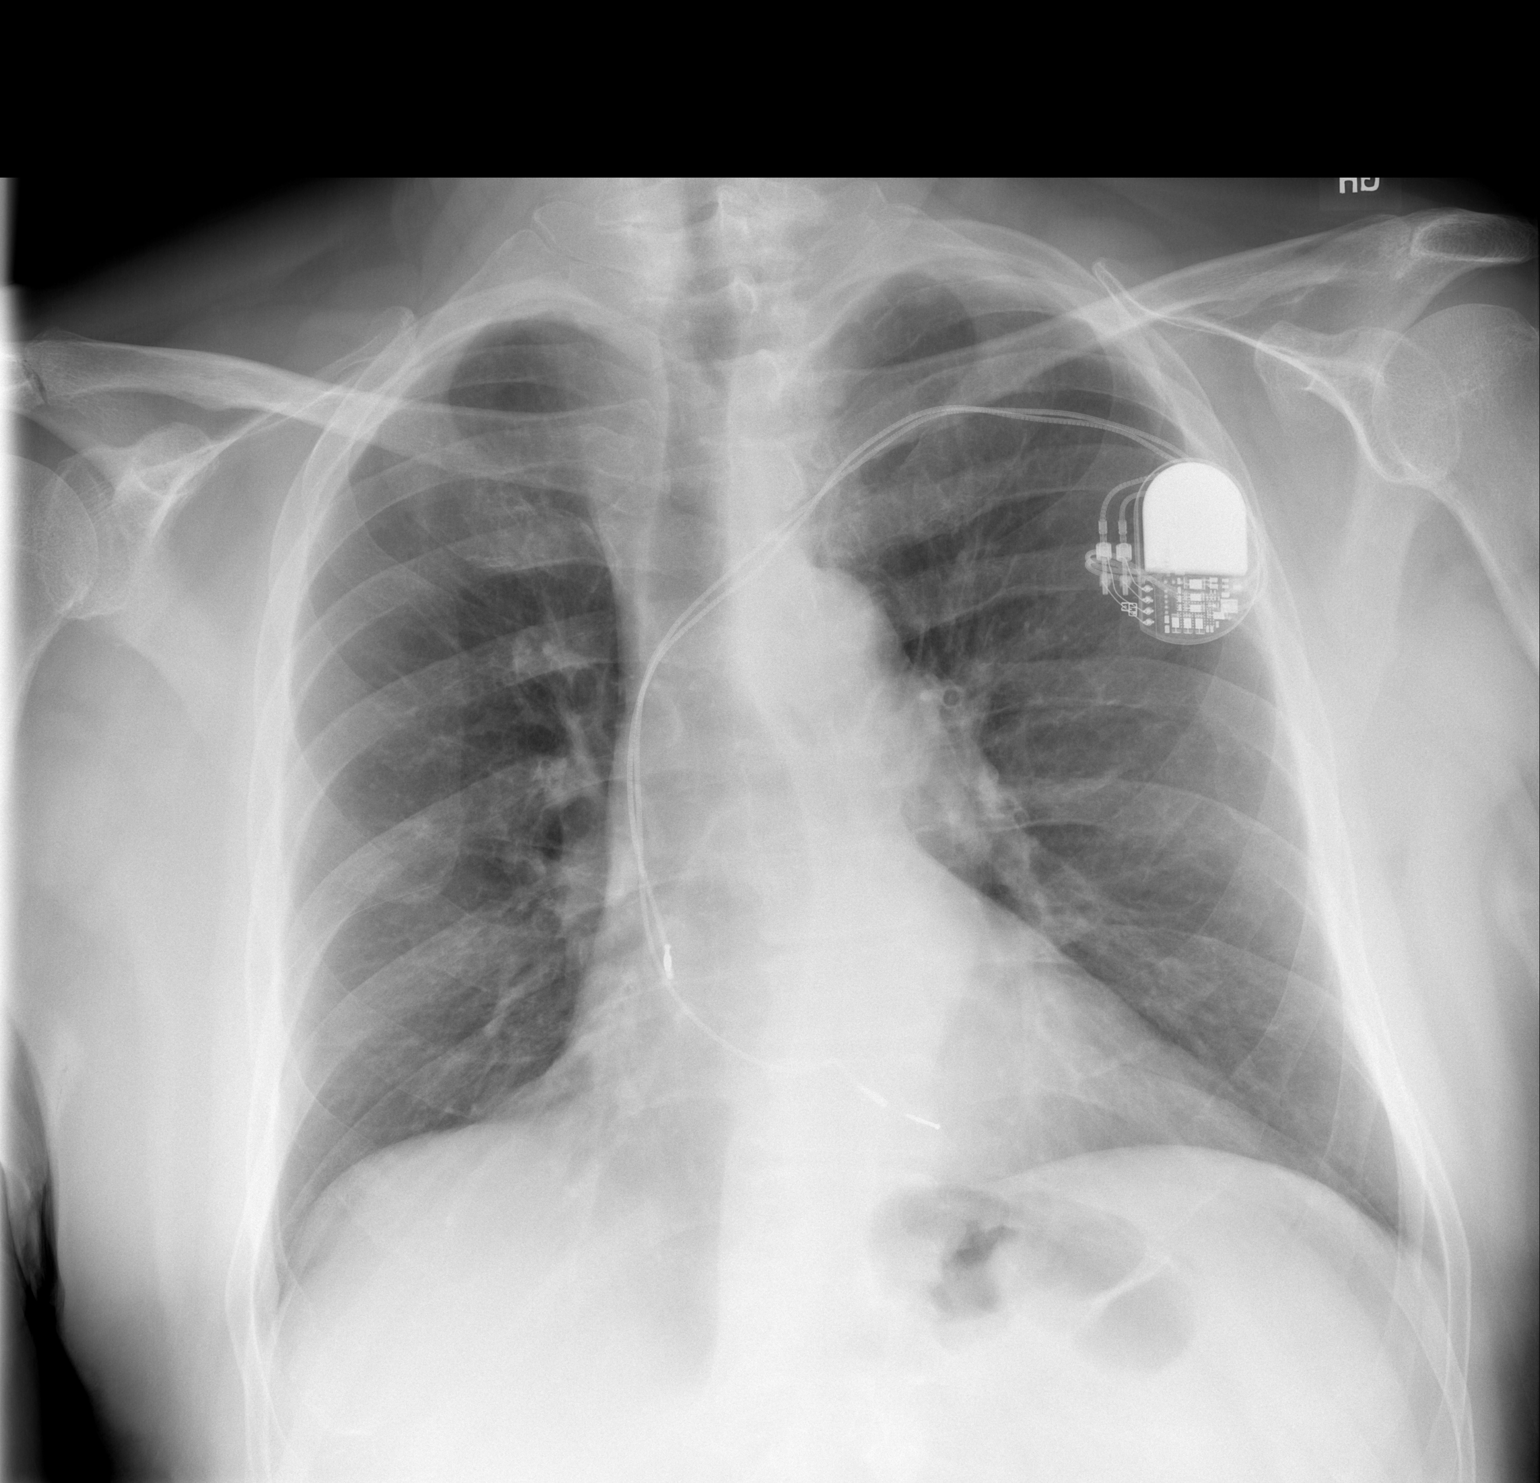

[w chest lat]
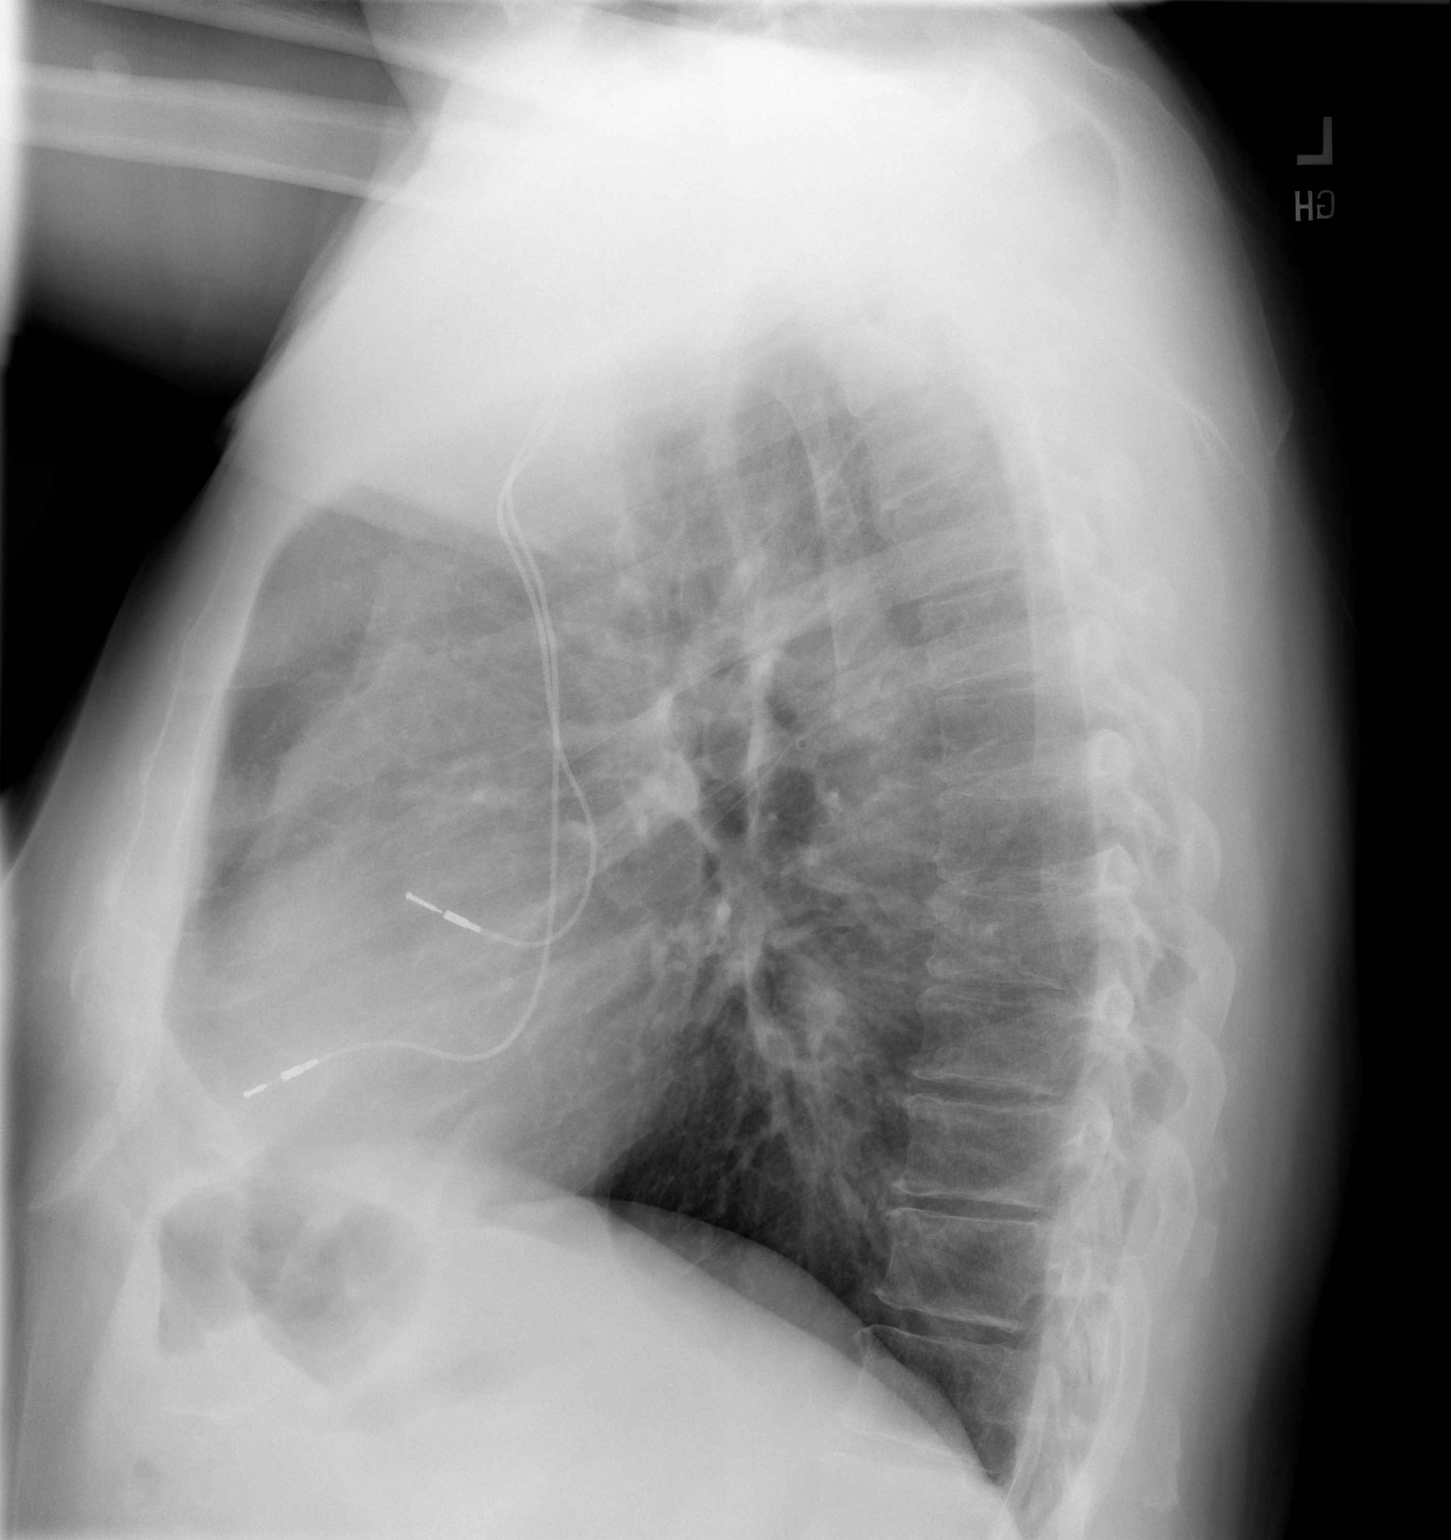

[2 of 2 positions shown; findings below may reference images not displayed]

FINDINGS: Stable positioning of left-sided implanted cardiac device. The heart
size and mediastinal contours are within normal limits. No focal
airspace consolidation, pleural effusion, or pneumothorax. The
visualized skeletal structures are unremarkable.
IMPRESSION: No active cardiopulmonary disease.

## 2023-01-08 NOTE — Progress Notes (Signed)
Remote pacemaker transmission.   

## 2023-02-18 DIAGNOSIS — K2100 Gastro-esophageal reflux disease with esophagitis, without bleeding: Secondary | ICD-10-CM | POA: Diagnosis not present

## 2023-02-18 DIAGNOSIS — K589 Irritable bowel syndrome without diarrhea: Secondary | ICD-10-CM | POA: Diagnosis not present

## 2023-02-18 DIAGNOSIS — K648 Other hemorrhoids: Secondary | ICD-10-CM | POA: Diagnosis not present

## 2023-03-26 ENCOUNTER — Ambulatory Visit (INDEPENDENT_AMBULATORY_CARE_PROVIDER_SITE_OTHER): Payer: Medicare Other

## 2023-03-26 DIAGNOSIS — I495 Sick sinus syndrome: Secondary | ICD-10-CM | POA: Diagnosis not present

## 2023-03-30 ENCOUNTER — Telehealth: Payer: Self-pay

## 2023-03-30 LAB — CUP PACEART REMOTE DEVICE CHECK
Battery Impedance: 604 Ohm
Battery Remaining Longevity: 83 mo
Battery Voltage: 2.76 V
Brady Statistic AP VP Percent: 0 %
Brady Statistic AP VS Percent: 0 %
Brady Statistic AS VP Percent: 0 %
Brady Statistic AS VS Percent: 100 %
Date Time Interrogation Session: 20250113100100
Implantable Lead Connection Status: 753985
Implantable Lead Connection Status: 753985
Implantable Lead Implant Date: 20050503
Implantable Lead Implant Date: 20050503
Implantable Lead Location: 753859
Implantable Lead Location: 753860
Implantable Lead Model: 5092
Implantable Lead Model: 5594
Implantable Pulse Generator Implant Date: 20150922
Lead Channel Impedance Value: 554 Ohm
Lead Channel Impedance Value: 567 Ohm
Lead Channel Pacing Threshold Amplitude: 0.75 V
Lead Channel Pacing Threshold Pulse Width: 0.4 ms
Lead Channel Setting Pacing Amplitude: 1.5 V
Lead Channel Setting Pacing Amplitude: 5 V
Lead Channel Setting Pacing Pulse Width: 1 ms
Lead Channel Setting Sensing Sensitivity: 2.8 mV
Zone Setting Status: 755011
Zone Setting Status: 755011

## 2023-03-30 NOTE — Telephone Encounter (Signed)
 I spoke with the pt that we did receive his transmission.

## 2023-05-05 NOTE — Addendum Note (Signed)
 Addended by: Elease Etienne A on: 05/05/2023 03:08 PM   Modules accepted: Orders

## 2023-05-05 NOTE — Progress Notes (Signed)
 Remote pacemaker transmission.

## 2023-06-25 ENCOUNTER — Ambulatory Visit (INDEPENDENT_AMBULATORY_CARE_PROVIDER_SITE_OTHER): Payer: Medicare Other

## 2023-06-25 DIAGNOSIS — I495 Sick sinus syndrome: Secondary | ICD-10-CM | POA: Diagnosis not present

## 2023-06-26 LAB — CUP PACEART REMOTE DEVICE CHECK
Battery Impedance: 706 Ohm
Battery Remaining Longevity: 76 mo
Battery Voltage: 2.77 V
Brady Statistic AP VP Percent: 0 %
Brady Statistic AP VS Percent: 0 %
Brady Statistic AS VP Percent: 0 %
Brady Statistic AS VS Percent: 100 %
Date Time Interrogation Session: 20250410075318
Implantable Lead Connection Status: 753985
Implantable Lead Connection Status: 753985
Implantable Lead Implant Date: 20050503
Implantable Lead Implant Date: 20050503
Implantable Lead Location: 753859
Implantable Lead Location: 753860
Implantable Lead Model: 5092
Implantable Lead Model: 5594
Implantable Pulse Generator Implant Date: 20150922
Lead Channel Impedance Value: 513 Ohm
Lead Channel Impedance Value: 565 Ohm
Lead Channel Pacing Threshold Amplitude: 0.625 V
Lead Channel Pacing Threshold Pulse Width: 0.4 ms
Lead Channel Setting Pacing Amplitude: 1.5 V
Lead Channel Setting Pacing Amplitude: 5 V
Lead Channel Setting Pacing Pulse Width: 1 ms
Lead Channel Setting Sensing Sensitivity: 2.8 mV
Zone Setting Status: 755011
Zone Setting Status: 755011

## 2023-07-04 ENCOUNTER — Encounter: Payer: Self-pay | Admitting: Cardiovascular Disease

## 2023-08-07 NOTE — Progress Notes (Signed)
 Remote pacemaker transmission.

## 2023-08-07 NOTE — Addendum Note (Signed)
 Addended by: Lott Rouleau A on: 08/07/2023 08:10 AM   Modules accepted: Orders

## 2023-09-24 ENCOUNTER — Ambulatory Visit: Payer: Medicare Other

## 2023-09-24 DIAGNOSIS — I495 Sick sinus syndrome: Secondary | ICD-10-CM

## 2023-09-28 ENCOUNTER — Ambulatory Visit: Payer: Self-pay | Admitting: Cardiovascular Disease

## 2023-09-28 LAB — CUP PACEART REMOTE DEVICE CHECK
Battery Impedance: 783 Ohm
Battery Remaining Longevity: 72 mo
Battery Voltage: 2.75 V
Brady Statistic AP VP Percent: 0 %
Brady Statistic AP VS Percent: 0 %
Brady Statistic AS VP Percent: 0 %
Brady Statistic AS VS Percent: 100 %
Date Time Interrogation Session: 20250711103623
Implantable Lead Connection Status: 753985
Implantable Lead Connection Status: 753985
Implantable Lead Implant Date: 20050503
Implantable Lead Implant Date: 20050503
Implantable Lead Location: 753859
Implantable Lead Location: 753860
Implantable Lead Model: 5092
Implantable Lead Model: 5594
Implantable Pulse Generator Implant Date: 20150922
Lead Channel Impedance Value: 554 Ohm
Lead Channel Impedance Value: 586 Ohm
Lead Channel Pacing Threshold Amplitude: 0.625 V
Lead Channel Pacing Threshold Pulse Width: 0.4 ms
Lead Channel Setting Pacing Amplitude: 1.5 V
Lead Channel Setting Pacing Amplitude: 5 V
Lead Channel Setting Pacing Pulse Width: 1 ms
Lead Channel Setting Sensing Sensitivity: 2.8 mV
Zone Setting Status: 755011
Zone Setting Status: 755011

## 2023-12-10 ENCOUNTER — Telehealth: Payer: Self-pay | Admitting: Cardiovascular Disease

## 2023-12-10 NOTE — Telephone Encounter (Signed)
 Spoke with pt and explained that after going back into prior OV notes, he had been dx with sick sinus syndrome and neurocardiogenic syncope. Explained to pt the meaning of these 2 diagnoses and why they had placed the PPM. Pt verbalized understanding of information and thanked us  for a quick call back and thorough explanation of everything. Pt denied any further questions or concerns at this time.

## 2023-12-10 NOTE — Telephone Encounter (Signed)
 Pt is requesting a callback to discuss his heart condition. His adult children do not know what to tell their doctors for family history. He does not understand what he is reading on mychart, and would like it explained in terms he can understand. Pt states it is okay to leave a detailed voicemail if he doesn't answer. He has some questions and concerns he would like clarity on. Please advise.

## 2023-12-24 ENCOUNTER — Ambulatory Visit: Payer: Medicare Other

## 2023-12-24 DIAGNOSIS — I495 Sick sinus syndrome: Secondary | ICD-10-CM

## 2023-12-24 LAB — CUP PACEART REMOTE DEVICE CHECK
Battery Impedance: 809 Ohm
Battery Remaining Longevity: 70 mo
Battery Voltage: 2.76 V
Brady Statistic AP VP Percent: 0 %
Brady Statistic AP VS Percent: 0 %
Brady Statistic AS VP Percent: 0 %
Brady Statistic AS VS Percent: 100 %
Date Time Interrogation Session: 20251009062252
Implantable Lead Connection Status: 753985
Implantable Lead Connection Status: 753985
Implantable Lead Implant Date: 20050503
Implantable Lead Implant Date: 20050503
Implantable Lead Location: 753859
Implantable Lead Location: 753860
Implantable Lead Model: 5092
Implantable Lead Model: 5594
Implantable Pulse Generator Implant Date: 20150922
Lead Channel Impedance Value: 523 Ohm
Lead Channel Impedance Value: 551 Ohm
Lead Channel Pacing Threshold Amplitude: 0.625 V
Lead Channel Pacing Threshold Pulse Width: 0.4 ms
Lead Channel Setting Pacing Amplitude: 1.5 V
Lead Channel Setting Pacing Amplitude: 5 V
Lead Channel Setting Pacing Pulse Width: 1 ms
Lead Channel Setting Sensing Sensitivity: 2.8 mV
Zone Setting Status: 755011
Zone Setting Status: 755011

## 2023-12-24 NOTE — Progress Notes (Signed)
 Remote PPM Transmission

## 2023-12-29 NOTE — Progress Notes (Signed)
 Remote PPM Transmission

## 2024-01-04 ENCOUNTER — Ambulatory Visit: Payer: Self-pay | Admitting: Cardiovascular Disease

## 2024-03-24 ENCOUNTER — Ambulatory Visit: Payer: Medicare Other

## 2024-03-24 DIAGNOSIS — I495 Sick sinus syndrome: Secondary | ICD-10-CM | POA: Diagnosis not present

## 2024-03-25 LAB — CUP PACEART REMOTE DEVICE CHECK
Battery Impedance: 886 Ohm
Battery Remaining Longevity: 66 mo
Battery Voltage: 2.76 V
Brady Statistic AP VP Percent: 0 %
Brady Statistic AP VS Percent: 0 %
Brady Statistic AS VP Percent: 0 %
Brady Statistic AS VS Percent: 100 %
Date Time Interrogation Session: 20260108081538
Implantable Lead Connection Status: 753985
Implantable Lead Connection Status: 753985
Implantable Lead Implant Date: 20050503
Implantable Lead Implant Date: 20050503
Implantable Lead Location: 753859
Implantable Lead Location: 753860
Implantable Lead Model: 5092
Implantable Lead Model: 5594
Implantable Pulse Generator Implant Date: 20150922
Lead Channel Impedance Value: 538 Ohm
Lead Channel Impedance Value: 551 Ohm
Lead Channel Pacing Threshold Amplitude: 0.625 V
Lead Channel Pacing Threshold Pulse Width: 0.4 ms
Lead Channel Setting Pacing Amplitude: 1.5 V
Lead Channel Setting Pacing Amplitude: 5 V
Lead Channel Setting Pacing Pulse Width: 1 ms
Lead Channel Setting Sensing Sensitivity: 2.8 mV
Zone Setting Status: 755011
Zone Setting Status: 755011

## 2024-03-29 ENCOUNTER — Ambulatory Visit: Payer: Self-pay | Admitting: Cardiovascular Disease

## 2024-03-29 NOTE — Progress Notes (Signed)
 Remote PPM Transmission
